# Patient Record
Sex: Male | Born: 1980 | Hispanic: No | Marital: Married | Smoking: Never smoker
Health system: Southern US, Community
[De-identification: ages and names within clinical notes are randomized; demographics above are authoritative.]

## PROBLEM LIST (undated history)

## (undated) DIAGNOSIS — E669 Obesity, unspecified: Secondary | ICD-10-CM

## (undated) HISTORY — PX: NO PAST SURGERIES: SHX2092

## (undated) HISTORY — DX: Obesity, unspecified: E66.9

---

## 2015-11-19 ENCOUNTER — Encounter (HOSPITAL_BASED_OUTPATIENT_CLINIC_OR_DEPARTMENT_OTHER): Payer: Self-pay | Admitting: *Deleted

## 2015-11-19 ENCOUNTER — Emergency Department (HOSPITAL_BASED_OUTPATIENT_CLINIC_OR_DEPARTMENT_OTHER): Payer: 59

## 2015-11-19 ENCOUNTER — Emergency Department (HOSPITAL_BASED_OUTPATIENT_CLINIC_OR_DEPARTMENT_OTHER)
Admission: EM | Admit: 2015-11-19 | Discharge: 2015-11-19 | Disposition: A | Discharge status: Home / Self Care | Payer: 59 | Attending: Emergency Medicine | Admitting: Emergency Medicine

## 2015-11-19 DIAGNOSIS — K644 Residual hemorrhoidal skin tags: Secondary | ICD-10-CM | POA: Diagnosis not present

## 2015-11-19 DIAGNOSIS — K649 Unspecified hemorrhoids: Secondary | ICD-10-CM

## 2015-11-19 DIAGNOSIS — K6289 Other specified diseases of anus and rectum: Secondary | ICD-10-CM | POA: Diagnosis present

## 2015-11-19 LAB — COMPREHENSIVE METABOLIC PANEL
ALK PHOS: 63 U/L (ref 38–126)
ALT: 39 U/L (ref 17–63)
AST: 27 U/L (ref 15–41)
Albumin: 4.7 g/dL (ref 3.5–5.0)
Anion gap: 8 (ref 5–15)
BUN: 11 mg/dL (ref 6–20)
CALCIUM: 9.7 mg/dL (ref 8.9–10.3)
CHLORIDE: 103 mmol/L (ref 101–111)
CO2: 26 mmol/L (ref 22–32)
CREATININE: 0.95 mg/dL (ref 0.61–1.24)
Glucose, Bld: 107 mg/dL — ABNORMAL HIGH (ref 65–99)
Potassium: 4.3 mmol/L (ref 3.5–5.1)
SODIUM: 137 mmol/L (ref 135–145)
Total Bilirubin: 0.7 mg/dL (ref 0.3–1.2)
Total Protein: 8.2 g/dL — ABNORMAL HIGH (ref 6.5–8.1)

## 2015-11-19 LAB — CBC WITH DIFFERENTIAL/PLATELET
Basophils Absolute: 0 10*3/uL (ref 0.0–0.1)
Basophils Relative: 0 %
EOS ABS: 0.2 10*3/uL (ref 0.0–0.7)
EOS PCT: 1 %
HCT: 46.3 % (ref 39.0–52.0)
Hemoglobin: 15.5 g/dL (ref 13.0–17.0)
LYMPHS ABS: 3.1 10*3/uL (ref 0.7–4.0)
Lymphocytes Relative: 27 %
MCH: 26.8 pg (ref 26.0–34.0)
MCHC: 33.5 g/dL (ref 30.0–36.0)
MCV: 80.1 fL (ref 78.0–100.0)
MONO ABS: 0.9 10*3/uL (ref 0.1–1.0)
MONOS PCT: 8 %
Neutro Abs: 7.4 10*3/uL (ref 1.7–7.7)
Neutrophils Relative %: 64 %
PLATELETS: 246 10*3/uL (ref 150–400)
RBC: 5.78 MIL/uL (ref 4.22–5.81)
RDW: 13.3 % (ref 11.5–15.5)
WBC: 11.6 10*3/uL — ABNORMAL HIGH (ref 4.0–10.5)

## 2015-11-19 MED ORDER — CLINDAMYCIN HCL 150 MG PO CAPS: 300.0000 mg | ORAL_CAPSULE | Freq: Three times a day (TID) | ORAL | Status: AC

## 2015-11-19 MED ORDER — IOHEXOL 300 MG/ML  SOLN
100.0000 mL | Freq: Once | INTRAMUSCULAR | Status: AC | PRN
  Administered 2015-11-19: 100 mL via INTRAVENOUS

## 2015-11-19 MED ORDER — HYDROCORTISONE 2.5 % RE CREA: 1.0000 "application " | TOPICAL_CREAM | Freq: Two times a day (BID) | RECTAL | Status: DC

## 2015-11-19 MED ORDER — ACETAMINOPHEN 500 MG PO TABS
1000.0000 mg | ORAL_TABLET | Freq: Once | ORAL | Status: AC
  Administered 2015-11-19: 1000 mg via ORAL
  Filled 2015-11-19: qty 2

## 2015-11-19 NOTE — ED Notes (Signed)
Having a lot of pain at rectal area, states not feeling any swelling or burning at area, has been having a fever at night. Having pressure at rectum as well. Pain increased with BM. Has increased water intact and sitting in bathtub with little relief

## 2015-11-19 NOTE — ED Notes (Signed)
Patient transported to CT 

## 2015-11-19 NOTE — ED Notes (Signed)
MD at bedside. 

## 2015-11-19 NOTE — ED Notes (Signed)
Denies any bleeding from site or noted any blood on toilet paper

## 2015-11-19 NOTE — ED Notes (Signed)
DC instructions reviewed with pt, discussed Rx as written by EDP, discussed importance of taking all of abx as prescribed and the use and application of rectal cream also prescribed by EDP. Also discussed non-pharmacological pain control.  Pt shown MD consult name, address and phone number, explained to call for follow up appointment. Opportunity for questions provided. Teach Back Method used

## 2015-11-19 NOTE — ED Notes (Signed)
States has had hemorrhoids before, has tried some OTC creams but no relief with this episode

## 2015-11-19 NOTE — ED Provider Notes (Signed)
CSN: [id number]     Arrival date & time 11/19/15  0915 History   First MD Initiated Contact with Patient 11/19/15 [phone]     Chief Complaint  Patient presents with  . Rectal Pain     (Consider location/radiation/quality/duration/timing/severity/associated sxs/prior Treatment) HPI Comments: Rectal pain No bleeding Since last Monday All day, not just with using restroom, this pain is constant 8/10 Sitz bath helped a bit Worse when having BM Fever close to 100 overnight, advil No cough/runny nose/abd pain No constipation/diarrhea Hx of hemorrhoids before, but had bleeding    History reviewed. No pertinent past medical history. History reviewed. No pertinent past surgical history. History reviewed. No pertinent family history. Social History  Substance Use Topics  . Smoking status: Never Smoker   . Smokeless tobacco: None  . Alcohol Use: Yes     Comment: social    Review of Systems  Constitutional: Negative for fever.  HENT: Negative for sore throat.   Eyes: Negative for visual disturbance.  Respiratory: Negative for shortness of breath.   Cardiovascular: Negative for chest pain.  Gastrointestinal: Negative for nausea, vomiting, abdominal pain, diarrhea, blood in stool and anal bleeding.  Genitourinary: Negative for difficulty urinating.  Musculoskeletal: Negative for back pain and neck stiffness.  Skin: Negative for rash.  Neurological: Negative for syncope and headaches.      Allergies  Review of patient's allergies indicates no known allergies.  Home Medications   Prior to Admission medications   Not on File   BP 133/84 mmHg  Pulse 108  Temp(Src) 98.5 F (36.9 C) (Oral)  Resp 20  Wt 228 lb (103.42 kg)  SpO2 98% Physical Exam  Constitutional: He is oriented to person, place, and time. He appears well-developed and well-nourished. No distress.  HENT:  Head: Normocephalic and atraumatic.  Eyes: Conjunctivae and EOM are normal.  Neck: Normal range of  motion.  Cardiovascular: Normal rate, regular rhythm, normal heart sounds and intact distal pulses.  Exam reveals no gallop and no friction rub.   No murmur heard. Pulmonary/Chest: Effort normal and breath sounds normal. No respiratory distress. He has no wheezes. He has no rales.  Abdominal: Soft. He exhibits no distension. There is no tenderness. There is no guarding.  Genitourinary: Rectal exam shows external hemorrhoid and tenderness (tenderness posterior to anus, swelling with tenderness, hemorrhoids, no clear fluctuance, significant pain).  Musculoskeletal: He exhibits no edema.  Neurological: He is alert and oriented to person, place, and time.  Skin: Skin is warm and dry. He is not diaphoretic.  Nursing note and vitals reviewed.   ED Course  Procedures (including critical care time) Labs Review Labs Reviewed - No data to display  Imaging Review No results found. I have personally reviewed and evaluated these images and lab results as part of my medical decision-making.   EKG Interpretation None      MDM   Final diagnoses:  None   35yo male with prior history of hemorrhoids presents with concern for constant rectal pain for 1 week.  Exam significant for hemorrhoids, and question of other area of tenderness/edema and given history of fever last night, ordered CT with contrast to evaluate for perirectal abscess.  CT pelvis showed possible early small abscess however favor edema. GIven small area, no clear fluctuance or area to drain on exam do not feel drainage or surgical consultation is indicated emergently, however given hx of fever, mild leukocytosis, will treat with clindamycin and provide information for follow up with surgery.  Patient  with signs of hemorrhoid on exam and recommended hemorrhoid care as well.  Patient discharged in stable condition with understanding of reasons to return.   Alvira Monday, MD 11/19/15 2124

## 2016-07-27 ENCOUNTER — Encounter: Payer: Self-pay | Admitting: Family Medicine

## 2016-07-27 ENCOUNTER — Ambulatory Visit (INDEPENDENT_AMBULATORY_CARE_PROVIDER_SITE_OTHER): Payer: 59 | Admitting: Family Medicine

## 2016-07-27 VITALS — BP 100/60 | HR 83 | Temp 99.0°F | Ht 73.0 in | Wt 239.8 lb

## 2016-07-27 DIAGNOSIS — E669 Obesity, unspecified: Secondary | ICD-10-CM | POA: Diagnosis not present

## 2016-07-27 DIAGNOSIS — M9902 Segmental and somatic dysfunction of thoracic region: Secondary | ICD-10-CM | POA: Diagnosis not present

## 2016-07-27 DIAGNOSIS — Z114 Encounter for screening for human immunodeficiency virus [HIV]: Secondary | ICD-10-CM

## 2016-07-27 HISTORY — DX: Obesity, unspecified: E66.9

## 2016-07-27 LAB — LIPID PANEL
CHOL/HDL RATIO: 5
Cholesterol: 221 mg/dL — ABNORMAL HIGH (ref 0–200)
HDL: 44 mg/dL (ref >39.00)
LDL CALC: 153 mg/dL — AB (ref 0–99)
NONHDL: 177.46
Triglycerides: 121 mg/dL (ref 0.0–149.0)
VLDL: 24.2 mg/dL (ref 0.0–40.0)

## 2016-07-27 LAB — COMPREHENSIVE METABOLIC PANEL
ALT: 44 U/L (ref 0–53)
AST: 26 U/L (ref 0–37)
Albumin: 4.3 g/dL (ref 3.5–5.2)
Alkaline Phosphatase: 60 U/L (ref 39–117)
BILIRUBIN TOTAL: 0.4 mg/dL (ref 0.2–1.2)
BUN: 8 mg/dL (ref 6–23)
CALCIUM: 9.4 mg/dL (ref 8.4–10.5)
CHLORIDE: 103 meq/L (ref 96–112)
CO2: 30 meq/L (ref 19–32)
CREATININE: 0.9 mg/dL (ref 0.40–1.50)
GFR: 102.84 mL/min (ref >60.00)
Glucose, Bld: 78 mg/dL (ref 70–99)
Potassium: 4.6 mEq/L (ref 3.5–5.1)
SODIUM: 139 meq/L (ref 135–145)
Total Protein: 7.9 g/dL (ref 6.0–8.3)

## 2016-07-27 LAB — HEMOGLOBIN A1C: Hgb A1c MFr Bld: 5.8 % (ref 4.6–6.5)

## 2016-07-27 NOTE — Progress Notes (Signed)
Chief Complaint  Patient presents with  . Establish Care    pt requesting CPE,discuss shoulder pain (L)x 6-7 mos,and hemrrhoids       New Patient Visit  SUBJECTIVE: HPI: Jacob Krause is an 35 y.o.male who is being seen for establishing care.  The patient Has not seen a primary care provider in many years.  The patient has had 6-7 months of intermittent discomfort on the left side of his neck, upper back, and shoulder. There was no injury or change in activity that started this. He believes it may be due to his posture at work. He tends to get it at the end of the day and last through the night into the following morning. He does not have it currently. He describes a sharp and aching, radiating down his back. He has tried BenGay that has been helpful. He does not exercise routinely. He denies weakness, numbness, tingling, or swelling.  No Known Allergies  Past Medical History:  Diagnosis Date  . Obesity 07/27/2016   History reviewed. No pertinent surgical history. Social History   Social History  . Marital status: Married   Social History Main Topics  . Smoking status: Never Smoker  . Smokeless tobacco: Never Used  . Alcohol use Yes     Comment: social  . Drug use: No   Family History  Problem Relation Age of Onset  . Cancer Father   . Diabetes Father   . Diabetes Paternal Grandmother   . Heart attack Paternal Grandfather    Takes no medications routinely.  ROS MSK: As noted in history of present illness   Neuro: Denies Numbness, tingling, weakness    OBJECTIVE: BP 100/60 (BP Location: Left Arm, Patient Position: Sitting, Cuff Size: Large)   Pulse 83   Temp 99 F (37.2 C) (Oral)   Ht 6\' 1"  (1.854 m)   Wt 239 lb 12.8 oz (108.8 kg)   SpO2 98%   BMI 31.64 kg/m   Constitutional: -  VS reviewed -  Well developed, well nourished, appears stated age -  No apparent distress  Psychiatric: -  Oriented to person, place, and time -  Memory intact -  Affect  and mood normal -  Fluent conversation, good eye contact -  Judgment and insight age appropriate  Eye: -  Conjunctivae clear, no discharge -  Pupils symmetric, round, reactive to light  Neck: -  No gross swelling, no palpable masses -  Thyroid midline, not enlarged, mobile, no palpable masses  Cardiovascular: -  RRR, no murmurs -  No LE edema  Respiratory: -  Normal respiratory effort, no accessory muscle use, no retraction -  Breath sounds equal, no wheezes, no ronchi, no crackles  Neurological:  -  CN II - XII grossly intact -  UE DTR's 1/4 b/l wo clonus -  Sensation grossly intact to light touch, equal bilaterally  Musculoskeletal: -  No clubbing, no cyanosis -  Gait normal -  Negative O'Brien's, speed's, Crossover, Crossover O'Brien's, empty can, Neer's, Leanord Asal, Liftoff, Spurling's   Skin: -  No significant lesion on inspection -  Warm and dry to palpation   ASSESSMENT/PLAN: Somatic dysfunction of spine, thoracic  Obesity - Plan: Hemoglobin A1c, Comprehensive metabolic panel, Lipid panel  Encounter for screening for HIV - Plan: HIV antibody  Orders as above. Home stretches and exercises were provided. He'll try this for 4-6 weeks and if symptoms fail to improve, he will let us know and we'll try physical therapy at that  time. Patient should return for complete physical if he wishes, or a can return in one year for complete physical. I will see him sooner if his labs are abnormal. The patient voiced understanding and agreement to the plan.   Jilda Rocheicholas Paul [place], DO 07/27/16  10:03 AM

## 2016-07-27 NOTE — Patient Instructions (Signed)
EXERCISES RANGE OF MOTION (ROM) AND STRETCHING EXERCISES   Restoring tissue flexibility helps normal motion to return to the joints. This allows healthier, less painful movement and activity.  An effective stretch should be held for at least 20 seconds, although you may need to begin with shorter hold times for comfort.  A stretch should never be painful. You should only feel a gentle lengthening or release in the stretched tissue.  STRETCH- Axial Extensors  Lie on your back on the floor. You may bend your knees for comfort. Place a rolled-up hand towel or dish towel, about 2 inches in diameter, under the part of your head that makes contact with the floor.  Gently tuck your chin, as if trying to make a "double chin," until you feel a gentle stretch at the base of your head.  Hold seconds 15-20 seconds. Repeat 2-3 times. Complete this exercise 1 time per day.   STRETCH - Axial Extension   Stand or sit on a firm surface. Assume a good posture: chest up, shoulders drawn back, abdominal muscles slightly tense, knees unlocked (if standing) and feet hip width apart.  Slowly retract your chin so your head slides back and your chin slightly lowers. Continue to look straight ahead.  You should feel a gentle stretch in the back of your head. Be certain not to feel an aggressive stretch since this can cause headaches later.  Hold for __________ seconds. Repeat __________ times. Complete this exercise __________ times per day.  STRETCH - Cervical Side Bend   Stand or sit on a firm surface. Assume a good posture: chest up, shoulders drawn back, abdominal muscles slightly tense, knees unlocked (if standing) and feet hip width apart.  Without letting your nose or shoulders move, slowly tip your right / left ear to your shoulder until your feel a gentle stretch in the muscles on the opposite side of your neck.  Hold __________ seconds. Repeat __________ times. Complete this exercise __________  times per day.  STRETCH - Cervical Rotators   Stand or sit on a firm surface. Assume a good posture: chest up, shoulders drawn back, abdominal muscles slightly tense, knees unlocked (if standing) and feet hip width apart.  Keeping your eyes level with the ground, slowly turn your head until you feel a gentle stretch along the back and opposite side of your neck.  Hold __________ seconds. Repeat __________ times. Complete this exercise __________ times per day.  RANGE OF MOTION - Neck Circles   Stand or sit on a firm surface. Assume a good posture: chest up, shoulders drawn back, abdominal muscles slightly tense, knees unlocked (if standing) and feet hip width apart.  Gently roll your head down and around from the back of one shoulder to the back of the other. The motion should never be forced or painful.  Repeat the motion 10-20 times, or until you feel the neck muscles relax and loosen. Repeat __________ times. Complete the exercise __________ times per day. STRENGTHENING EXERCISES - Cervical Strain and Sprain These exercises may help you when beginning to rehabilitate your injury. They may resolve your symptoms with or without further involvement from your physician, physical therapist, or athletic trainer. While completing these exercises, remember:   Muscles can gain both the endurance and the strength needed for everyday activities through controlled exercises.  Complete these exercises as instructed by your physician, physical therapist, or athletic trainer. Progress the resistance and repetitions only as guided.  You may experience muscle soreness or fatigue,  but the pain or discomfort you are trying to eliminate should never worsen during these exercises. If this pain does worsen, stop and make certain you are following the directions exactly. If the pain is still present after adjustments, discontinue the exercise until you can discuss the trouble with your clinician.  STRENGTH -  Cervical Flexors, Isometric  Face a wall, standing about 6 inches away. Place a small pillow, a ball about 6-8 inches in diameter, or a folded towel between your forehead and the wall.  Slightly tuck your chin and gently push your forehead into the soft object. Push only with mild to moderate intensity, building up tension gradually. Keep your jaw and forehead relaxed.  Hold 10 to 20 seconds. Keep your breathing relaxed.  Release the tension slowly. Relax your neck muscles completely before you start the next repetition. Repeat __________ times. Complete this exercise __________ times per day.  STRENGTH- Cervical Lateral Flexors, Isometric   Stand about 6 inches away from a wall. Place a small pillow, a ball about 6-8 inches in diameter, or a folded towel between the side of your head and the wall.  Slightly tuck your chin and gently tilt your head into the soft object. Push only with mild to moderate intensity, building up tension gradually. Keep your jaw and forehead relaxed.  Hold 10 to 20 seconds. Keep your breathing relaxed.  Release the tension slowly. Relax your neck muscles completely before you start the next repetition. Repeat __________ times. Complete this exercise __________ times per day.  STRENGTH - Cervical Extensors, Isometric   Stand about 6 inches away from a wall. Place a small pillow, a ball about 6-8 inches in diameter, or a folded towel between the back of your head and the wall.  Slightly tuck your chin and gently tilt your head back into the soft object. Push only with mild to moderate intensity, building up tension gradually. Keep your jaw and forehead relaxed.  Hold 10 to 20 seconds. Keep your breathing relaxed.  Release the tension slowly. Relax your neck muscles completely before you start the next repetition. Repeat __________ times. Complete this exercise __________ times per day. POSTURE AND BODY MECHANICS CONSIDERATIONS - Cervical Strain and  Sprain Keeping correct posture when sitting, standing or completing your activities will reduce the stress put on different body tissues, allowing injured tissues a chance to heal and limiting painful experiences. The following are general guidelines for improved posture. Your physician or physical therapist will provide you with any instructions specific to your needs. While reading these guidelines, remember:  The exercises prescribed by your provider will help you have the flexibility and strength to maintain correct postures.  The correct posture provides the optimal environment for your joints to work. All of your joints have less wear and tear when properly supported by a spine with good posture. This means you will experience a healthier, less painful body.  Correct posture must be practiced with all of your activities, especially prolonged sitting and standing. Correct posture is as important when doing repetitive low-stress activities (typing) as it is when doing a single heavy-load activity (lifting).  PROLONGED STANDING WHILE SLIGHTLY LEANING FORWARD When completing a task that requires you to lean forward while standing in one place for a long time, place either foot up on a stationary 2- to 4-inch high object to help maintain the best posture. When both feet are on the ground, the low back tends to lose its slight inward curve. If this curve  flattens (or becomes too large), then the back and your other joints will experience too much stress, fatigue more quickly, and can cause pain.   RESTING POSITIONS Consider which positions are most painful for you when choosing a resting position. If you have pain with flexion-based activities (sitting, bending, stooping, squatting), choose a position that allows you to rest in a less flexed posture. You would want to avoid curling into a fetal position on your side. If your pain worsens with extension-based activities (prolonged standing, working  overhead), avoid resting in an extended position such as sleeping on your stomach. Most people will find more comfort when they rest with their spine in a more neutral position, neither too rounded nor too arched. Lying on a non-sagging bed on your side with a pillow between your knees, or on your back with a pillow under your knees will often provide some relief. Keep in mind, being in any one position for a prolonged period of time, no matter how correct your posture, can still lead to stiffness.  WALKING Walk with an upright posture. Your ears, shoulders, and hips should all line up. OFFICE WORK When working at a desk, create an environment that supports good, upright posture. Without extra support, muscles fatigue and lead to excessive strain on joints and other tissues.  CHAIR:  A chair should be able to slide under your desk when your back makes contact with the back of the chair. This allows you to work closely.  The chair's height should allow your eyes to be level with the upper part of your monitor and your hands to be slightly lower than your elbows.  Body position: ? Your feet should make contact with the floor. If this is not possible, use a foot rest. ? Keep your ears over your shoulders. This will reduce stress on your neck and low back.

## 2016-07-27 NOTE — Progress Notes (Signed)
Pre visit review using our clinic review tool, if applicable. No additional management support is needed unless otherwise documented below in the visit note. 

## 2016-07-28 LAB — HIV ANTIBODY (ROUTINE TESTING W REFLEX): HIV 1&2 Ab, 4th Generation: NONREACTIVE

## 2017-06-10 IMAGING — CT CT PELVIS W/ CM
2 of 3 series · 16 of 46 positions shown, 18 images · IV contrast (omnipaque)
Comparison: None.

CLINICAL DATA: Rectal pain for 1 week. History of hemorrhoids.
Increasing pain with bowel movements. Denies bleeding.

EXAM:
CT PELVIS WITH CONTRAST
TECHNIQUE: Multidetector CT imaging of the pelvis was performed using the
standard protocol following the bolus administration of intravenous
contrast.
CONTRAST:  100mL OMNIPAQUE IOHEXOL 300 MG/ML  SOLN

[Series 2: axial st · axial · 0.98mm/px · z∈[-410,-100]mm · 13 of 72 slices shown, 15 images]
[im 5/72  soft-tissue]
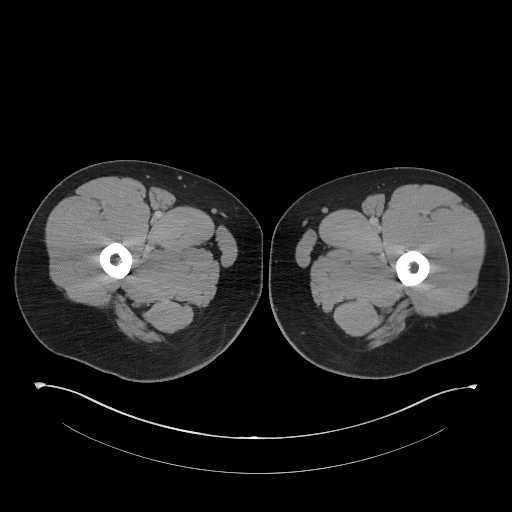
[im 5/72  bone]
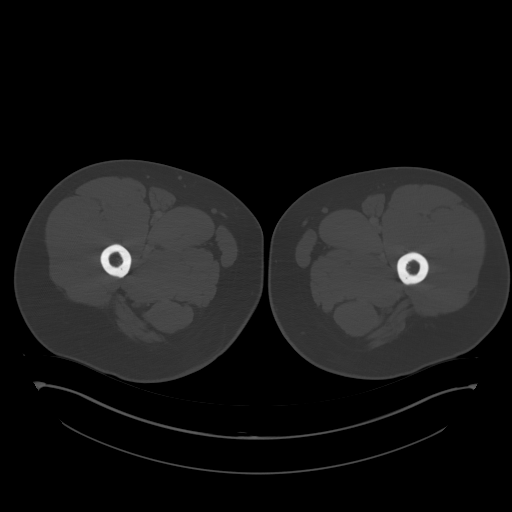
[im 10/72  soft-tissue]
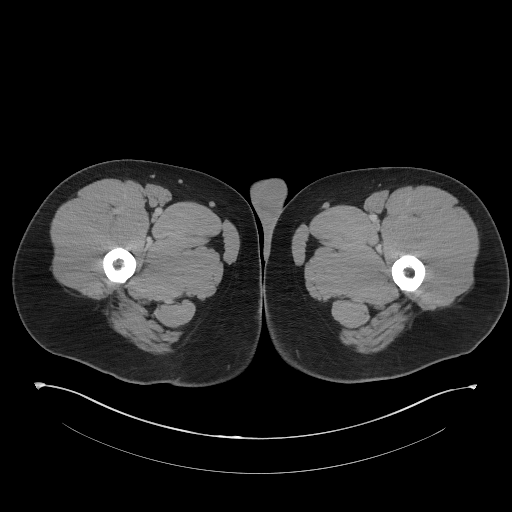
[im 14/72  soft-tissue]
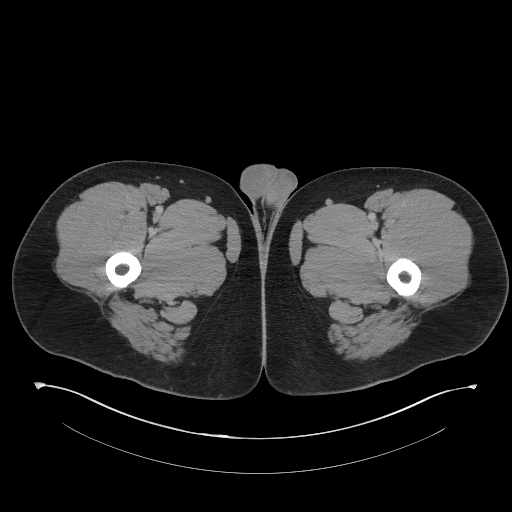
[im 21/72  soft-tissue]
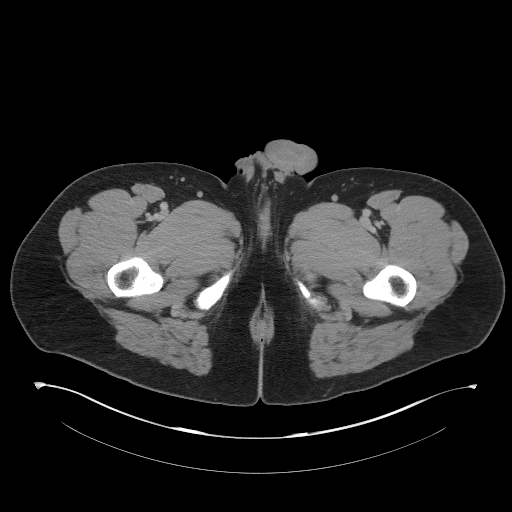
[im 26/72  soft-tissue]
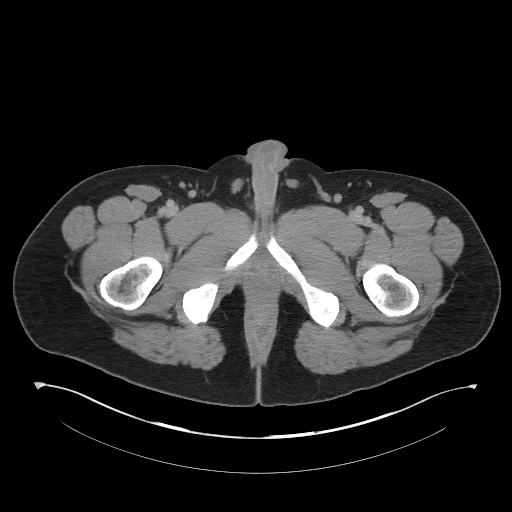
[im 30/72  soft-tissue]
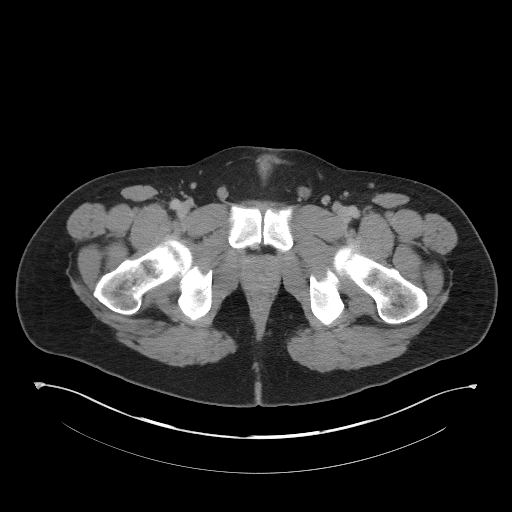
[im 37/72  soft-tissue]
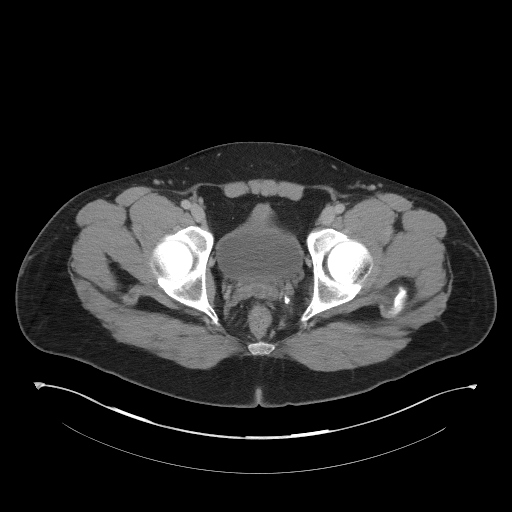
[im 42/72  soft-tissue]
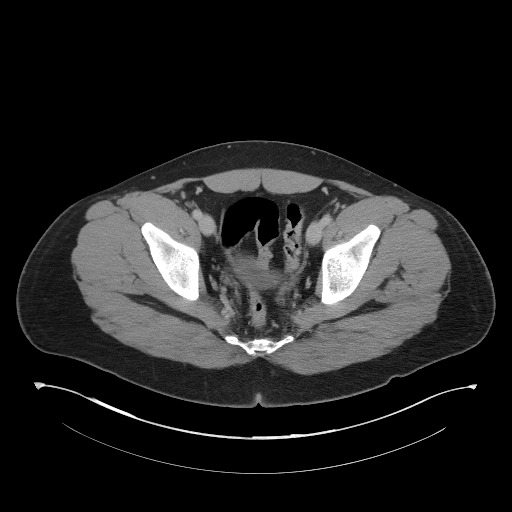
[im 46/72  soft-tissue]
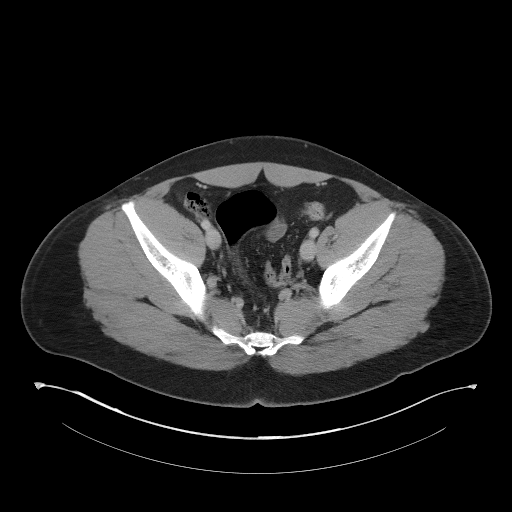
[im 46/72  bone]
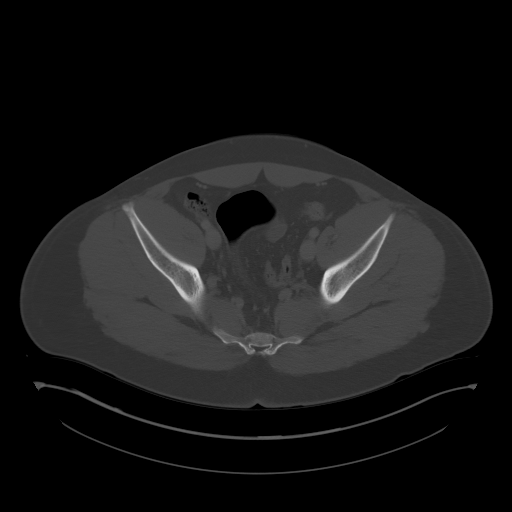
[im 51/72  soft-tissue]
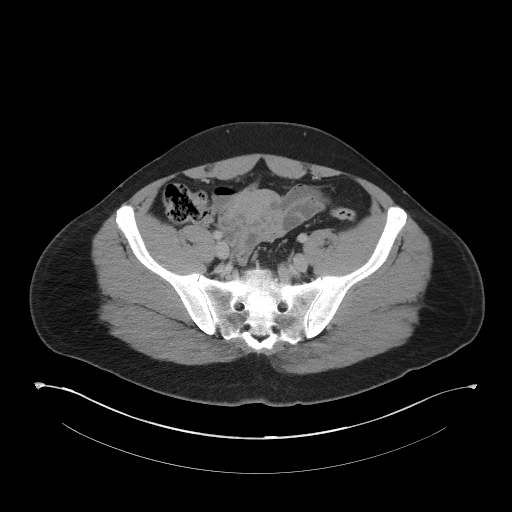
[im 58/72  soft-tissue]
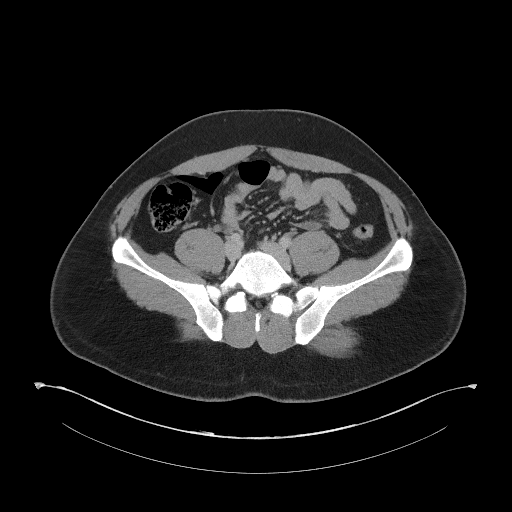
[im 62/72  soft-tissue]
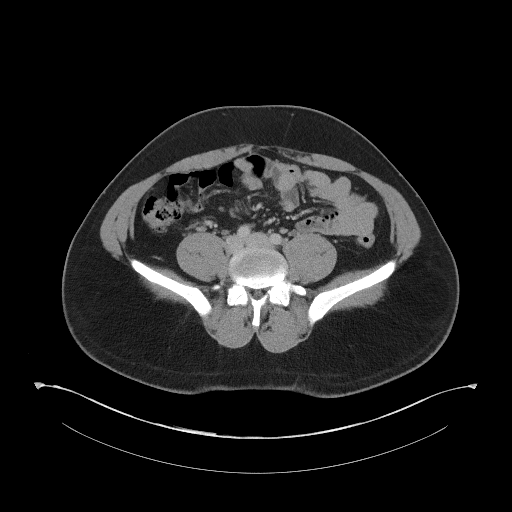
[im 67/72  soft-tissue]
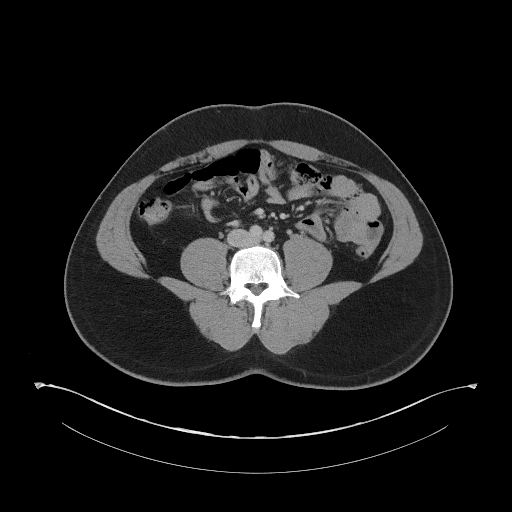

[Series 4: coronal st · coronal · 0.74mm/px · 3 of 90 slices shown]
[im 30/90  soft-tissue]
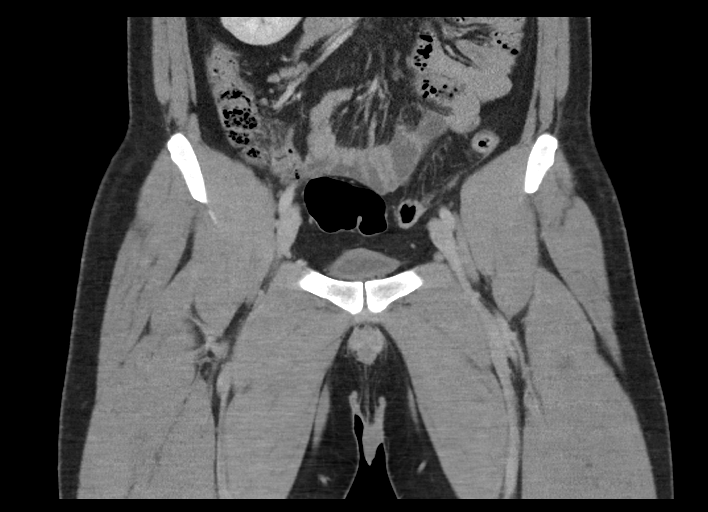
[im 40/90  soft-tissue]
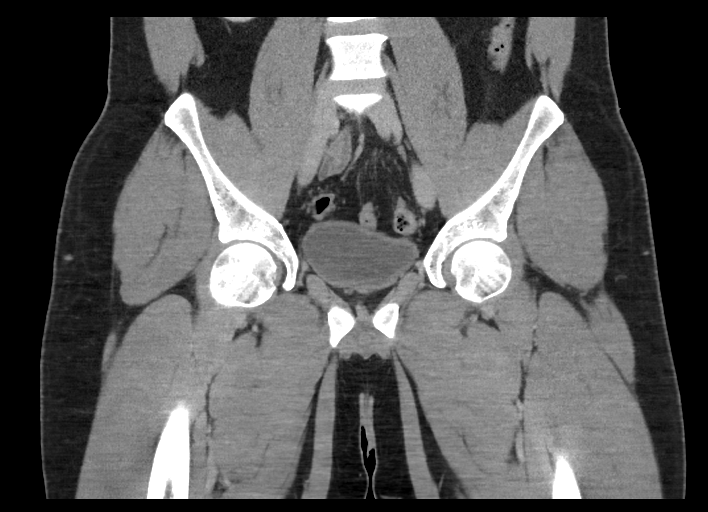
[im 50/90  soft-tissue]
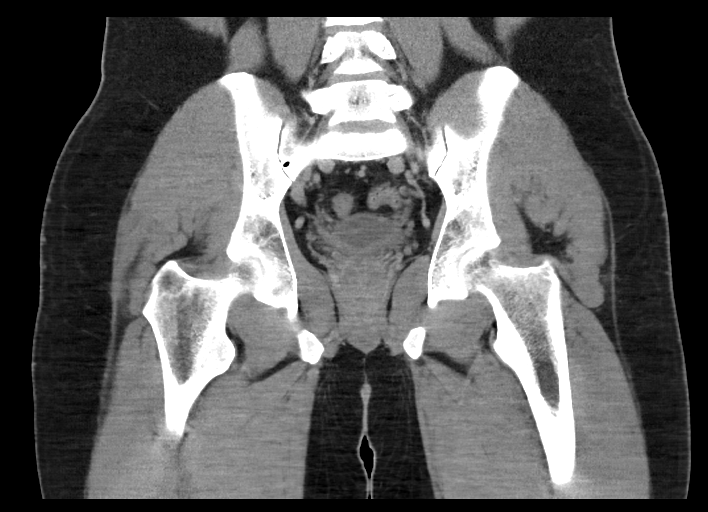

[16 of 46 positions shown; findings below may reference images not displayed]

FINDINGS: Pelvic portion of the bowel is normal in caliber. No bowel wall
thickening or evidence of bowel wall inflammation. No free fluid or
abscess collection identified within the intraperitoneal pelvis. No
free fluid or abscess collection within the perirectal soft tissues.
No free intraperitoneal air.

There is a subtle low-density focus adjacent to the anal verge,
measuring 11 x 8 mm. This is of uncertain significance but may
represent confluent edema within the anal wall or small perianal
abscess. The adjacent soft tissues of the perineum are unremarkable.

Mild degenerative change noted within the lower lumbar spine. No
acute osseous abnormality. Superficial subcutaneous soft tissues
about the pelvis are unremarkable.
IMPRESSION: 1. Subtle low-density focus at the level of the anal verge,
measuring 11 x 8 mm, suspicious for confluent edema within the anal
wall or possibly small abscess collection (series 2, image 47).
There is no air within the collection to confirm abscess, so favor
edema. No evidence of an adjacent fistula. The more superior
perirectal soft tissues appear normal.
2. Intraperitoneal pelvis is unremarkable. No intraperitoneal free
fluid or abscess collection. No bowel obstruction or bowel wall
inflammation.

## 2017-09-12 ENCOUNTER — Encounter: Payer: Self-pay | Admitting: Family Medicine

## 2017-09-12 ENCOUNTER — Ambulatory Visit (INDEPENDENT_AMBULATORY_CARE_PROVIDER_SITE_OTHER): Payer: 59 | Admitting: Family Medicine

## 2017-09-12 VITALS — BP 120/82 | HR 65 | Temp 98.5°F | Ht 73.0 in | Wt 205.2 lb

## 2017-09-12 DIAGNOSIS — K644 Residual hemorrhoidal skin tags: Secondary | ICD-10-CM

## 2017-09-12 DIAGNOSIS — Z0000 Encounter for general adult medical examination without abnormal findings: Secondary | ICD-10-CM

## 2017-09-12 LAB — CBC
HCT: 48.8 % (ref 39.0–52.0)
HEMOGLOBIN: 15.9 g/dL (ref 13.0–17.0)
MCHC: 32.7 g/dL (ref 30.0–36.0)
MCV: 85 fl (ref 78.0–100.0)
Platelets: 242 10*3/uL (ref 150.0–400.0)
RBC: 5.74 Mil/uL (ref 4.22–5.81)
RDW: 13.6 % (ref 11.5–15.5)
WBC: 8.2 10*3/uL (ref 4.0–10.5)

## 2017-09-12 LAB — COMPREHENSIVE METABOLIC PANEL
ALK PHOS: 60 U/L (ref 39–117)
ALT: 23 U/L (ref 0–53)
AST: 24 U/L (ref 0–37)
Albumin: 4.7 g/dL (ref 3.5–5.2)
BILIRUBIN TOTAL: 0.5 mg/dL (ref 0.2–1.2)
BUN: 8 mg/dL (ref 6–23)
CO2: 29 meq/L (ref 19–32)
CREATININE: 0.87 mg/dL (ref 0.40–1.50)
Calcium: 9.8 mg/dL (ref 8.4–10.5)
Chloride: 102 mEq/L (ref 96–112)
GFR: 106.23 mL/min (ref >60.00)
GLUCOSE: 91 mg/dL (ref 70–99)
Potassium: 4.5 mEq/L (ref 3.5–5.1)
SODIUM: 137 meq/L (ref 135–145)
Total Protein: 7.8 g/dL (ref 6.0–8.3)

## 2017-09-12 LAB — LIPID PANEL
Cholesterol: 206 mg/dL — ABNORMAL HIGH (ref 0–200)
HDL: 62.2 mg/dL (ref >39.00)
LDL Cholesterol: 131 mg/dL — ABNORMAL HIGH (ref 0–99)
NONHDL: 143.45
Total CHOL/HDL Ratio: 3
Triglycerides: 64 mg/dL (ref 0.0–149.0)
VLDL: 12.8 mg/dL (ref 0.0–40.0)

## 2017-09-12 MED ORDER — HYDROCORTISONE 2.5 % RE CREA: 1.0000 "application " | TOPICAL_CREAM | Freq: Two times a day (BID) | RECTAL | 0 refills | Status: AC

## 2017-09-12 NOTE — Progress Notes (Signed)
Pre visit review using our clinic review tool, if applicable. No additional management support is needed unless otherwise documented below in the visit note. 

## 2017-09-12 NOTE — Patient Instructions (Addendum)
Hemorrhoids Hemorrhoids are swollen veins in and around the rectum or anus. There are two types of hemorrhoids:  Internal hemorrhoids. These occur in the veins that are just inside the rectum. They may poke through to the outside and become irritated and painful.  External hemorrhoids. These occur in the veins that are outside of the anus and can be felt as a painful swelling or hard lump near the anus.  Most hemorrhoids do not cause serious problems, and they can be managed with home treatments such as diet and lifestyle changes. If home treatments do not help your symptoms, procedures can be done to shrink or remove the hemorrhoids. What are the causes? This condition is caused by increased pressure in the anal area. This pressure may result from various things, including:  Constipation.  Straining to have a bowel movement.  Diarrhea.  Pregnancy.  Obesity.  Sitting for long periods of time.  Heavy lifting or other activity that causes you to strain.  Anal sex.  What are the signs or symptoms? Symptoms of this condition include:  Pain.  Anal itching or irritation.  Rectal bleeding.  Leakage of stool (feces).  Anal swelling.  One or more lumps around the anus.  How is this diagnosed? This condition can often be diagnosed through a visual exam. Other exams or tests may also be done, such as:  Examination of the rectal area with a gloved hand (digital rectal exam).  Examination of the anal canal using a small tube (anoscope).  A blood test, if you have lost a significant amount of blood.  A test to look inside the colon (sigmoidoscopy or colonoscopy).  How is this treated? This condition can usually be treated at home. However, various procedures may be done if dietary changes, lifestyle changes, and other home treatments do not help your symptoms. These procedures can help make the hemorrhoids smaller or remove them completely. Some of these procedures involve  surgery, and others do not. Common procedures include:  Rubber band ligation. Rubber bands are placed at the base of the hemorrhoids to cut off the blood supply to them.  Sclerotherapy. Medicine is injected into the hemorrhoids to shrink them.  Infrared coagulation. A type of light energy is used to get rid of the hemorrhoids.  Hemorrhoidectomy surgery. The hemorrhoids are surgically removed, and the veins that supply them are tied off.  Stapled hemorrhoidopexy surgery. A circular stapling device is used to remove the hemorrhoids and use staples to cut off the blood supply to them.  Follow these instructions at home: Eating and drinking  Eat foods that have a lot of fiber in them, such as whole grains, beans, nuts, fruits, and vegetables. Ask your health care provider about taking products that have added fiber (fiber supplements).  Drink enough fluid to keep your urine clear or pale yellow. Managing pain and swelling  Take warm sitz baths for 20 minutes, 3-4 times a day to ease pain and discomfort.  If directed, apply ice to the affected area. Using ice packs between sitz baths may be helpful. ? Put ice in a plastic bag. ? Place a towel between your skin and the bag. ? Leave the ice on for 20 minutes, 2-3 times a day. General instructions  Take over-the-counter and prescription medicines only as told by your health care provider.  Use medicated creams or suppositories as told.  Exercise regularly.  Go to the bathroom when you have the urge to have a bowel movement. Do not wait.    Avoid straining to have bowel movements.  Keep the anal area dry and clean. Use wet toilet paper or moist towelettes after a bowel movement.  Do not sit on the toilet for long periods of time. This increases blood pooling and pain. Contact a health care provider if:  You have increasing pain and swelling that are not controlled by treatment or medicine.  You have uncontrolled bleeding.  You  have difficulty having a bowel movement, or you are unable to have a bowel movement.  You have pain or inflammation outside the area of the hemorrhoids. This information is not intended to replace advice given to you by your health care provider. Make sure you discuss any questions you have with your health care provider. Document Released: 10/12/2000 Document Revised: 03/14/2016 Document Reviewed: 06/29/2015 Elsevier Interactive Patient Education  2017 [employer]  

## 2017-09-12 NOTE — Progress Notes (Signed)
Chief Complaint  Patient presents with  . Annual Exam    hemorrhoids    Well Male Jacob Krause is here for a complete physical.   His last physical was >1 year ago.  Current diet: in general, a "healthy" diet   Current exercise: running, [employer] Weight trend: losing steadily, 35 lbs in one year Does pt snore? No. Daytime fatigue? No. Seat belt? Yes.     Health maintenance Tetanus- Yes HIV- Yes   He has been having issues with hemorrhoids over the past year and a half.  Since Monday, he has had one that is not going away.  It is not particularly painful or itchy.  There is no drainage or blood from it.  He has increased the fiber in his diet and with supplementation.  He is not straining or having issues with constipation.  He is tried an over-the-counter cream that has been mildly helpful.  He is getting better overall, however does not think the bulge is getting any smaller.  Past Medical History:  Diagnosis Date  . Obesity 07/27/2016    Past Surgical History:  Procedure Laterality Date  . NO PAST SURGERIES       Medications  Takes no medications routinely.   Allergies No Known Allergies   Family History Family History  Problem Relation Age of Onset  . Cancer Father   . Diabetes Father   . Diabetes Paternal Grandmother   . Heart attack Paternal Grandfather     Review of Systems: Constitutional:  no unexpected change in weight, no fevers or chills Eye:  no recent significant change in vision Ear/Nose/Mouth/Throat:  Ears:  no tinnitus or hearing loss Nose/Mouth/Throat:  no complaints of nasal congestion or bleeding, no sore throat and oral sores Cardiovascular:  no chest pain, no palpitations Respiratory:  no cough and no shortness of breath Gastrointestinal:  no abdominal pain, no change in bowel habits, no nausea, vomiting, diarrhea, or constipation and no black or bloody stool GU:  Male: negative for dysuria, frequency, and incontinence and negative  for prostate symptoms Musculoskeletal/Extremities:  no pain, redness, or swelling of the joints Integumentary (Skin/Breast): +lump in anal area; no abnormal skin lesions reported Neurologic:  no headaches, no numbness, tingling Endocrine: No weight changes, masses in the neck, heat/cold intolerance, bowel or skin changes, or cardiovascular system symptoms Hematologic/Lymphatic:  no abnormal bleeding, no HIV risk factors, no night sweats, no swollen nodes, no weight loss  Exam BP 120/82 (BP Location: Left Arm, Patient Position: Sitting, Cuff Size: Large)   Pulse 65   Temp 98.5 F (36.9 C) (Oral)   Ht 6\' 1"  (1.854 m)   Wt 205 lb 4 oz (93.1 kg)   SpO2 99%   BMI 27.08 kg/m  General:  well developed, well nourished, in no apparent distress Skin:  no significant moles, warts, or growths Head:  no masses, lesions, or tenderness Eyes:  pupils equal and round, sclera anicteric without injection Ears:  canals without lesions, TMs shiny without retraction, no obvious effusion, no erythema Nose:  nares patent, septum midline, mucosa normal Throat/Pharynx:  lips and gingiva without lesion; tongue and uvula midline; non-inflamed pharynx; no exudates or postnasal drainage Neck: neck supple without adenopathy, thyromegaly, or masses Lungs:  clear to auscultation, breath sounds equal bilaterally, no respiratory distress Cardio:  regular rate and rhythm without murmurs, heart sounds without clicks or rubs Abdomen:  abdomen soft, nontender; bowel sounds normal; no masses or organomegaly Rectal: external hem noted on R, mild  amount of thrombosis appreciated, no fluctuance, drainage, bleeding, skin breaks, erythema Musculoskeletal:  symmetrical muscle groups noted without atrophy or deformity Extremities:  no clubbing, cyanosis, or edema, no deformities, no skin discoloration Neuro:  gait normal; deep tendon reflexes normal and symmetric Psych: well oriented with normal range of affect and appropriate  judgment/insight  Assessment and Plan  Well adult exam - Plan: CBC, Comprehensive metabolic panel, Lipid panel  External hemorrhoid   Well 36 y.o. male. Counseled on diet and exercise. Other orders as above. Try rx strength steroid, f/u next week for removal of thrombus if no improvement. Follow up in 1 year pending the above workup otherwise. The patient voiced understanding and agreement to the plan.  Jacob Rocheicholas Paul [place], DO 09/12/17 12:09 PM

## 2018-12-30 ENCOUNTER — Encounter: Payer: Self-pay | Admitting: Internal Medicine

## 2018-12-30 ENCOUNTER — Ambulatory Visit (INDEPENDENT_AMBULATORY_CARE_PROVIDER_SITE_OTHER): Payer: 59 | Admitting: Internal Medicine

## 2018-12-30 VITALS — BP 108/72 | HR 82 | Temp 98.2°F | Resp 16 | Ht 73.0 in | Wt 203.4 lb

## 2018-12-30 DIAGNOSIS — M79604 Pain in right leg: Secondary | ICD-10-CM | POA: Diagnosis not present

## 2018-12-30 DIAGNOSIS — R509 Fever, unspecified: Secondary | ICD-10-CM

## 2018-12-30 MED ORDER — CYCLOBENZAPRINE HCL 10 MG PO TABS: 10.0000 mg | ORAL_TABLET | Freq: Every evening | ORAL | 0 refills | Status: DC | PRN

## 2018-12-30 NOTE — Patient Instructions (Addendum)
We will refer you to the orthopedic doctor  Take Flexeril as needed  IBUPROFEN (Advil or Motrin) 200 mg 2 tablets every 8 hours as needed for pain.  Always take it with food because may cause gastritis and ulcers.  If you notice nausea, stomach pain, change in the color of stools --->  Stop the medicine and let us know    Please use a mask for the next 2 to 3 days  Check your temperature daily, if it is higher than 100.4: Please call the office  Also call if you have severe cough, difficulty breathing, sore throat, nasal discharge.

## 2018-12-30 NOTE — Progress Notes (Signed)
Pre visit review using our clinic review tool, if applicable. No additional management support is needed unless otherwise documented below in the visit note. 

## 2018-12-30 NOTE — Progress Notes (Signed)
Subjective:    Patient ID: Jacob Krause, male    DOB: 05/05/1981, 38 y.o.   MRN: [id number]  DOS:  12/30/2018 Type of visit - description: Acute 3 months history of pain from the right buttock to the right heel.  Pain is described as a sharp feeling, tingling or numbness. The last few days has been getting worse. The pain is more persistent and increased when he stands up. Denies swelling.  Also, he reports when I ask that he had fever 2 days ago and cough.  Symptoms self resolved. He returned from a trip to [place] 10 days ago, he stopped in [place], [place]. He is currently feeling well. Last dose of Tylenol was 2 days ago.   Review of Systems No nausea, vomiting, diarrhea When he had cough he had no sputum production. No myalgias. No rash  Past Medical History:  Diagnosis Date  . Obesity 07/27/2016    Past Surgical History:  Procedure Laterality Date  . NO PAST SURGERIES      Social History   Socioeconomic History  . Marital status: Married    Spouse name: Not on file  . Number of children: Not on file  . Years of education: Not on file  . Highest education level: Not on file  Occupational History  . Not on file  Social Needs  . Financial resource strain: Not on file  . Food insecurity:    Worry: Not on file    Inability: Not on file  . Transportation needs:    Medical: Not on file    Non-medical: Not on file  Tobacco Use  . Smoking status: Never Smoker  . Smokeless tobacco: Never Used  Substance and Sexual Activity  . Alcohol use: Yes    Comment: social  . Drug use: No  . Sexual activity: Not on file  Lifestyle  . Physical activity:    Days per week: Not on file    Minutes per session: Not on file  . Stress: Not on file  Relationships  . Social connections:    Talks on phone: Not on file    Gets together: Not on file    Attends religious service: Not on file    Active member of club or organization: Not on file    Attends meetings  of clubs or organizations: Not on file    Relationship status: Not on file  . Intimate partner violence:    Fear of current or ex partner: Not on file    Emotionally abused: Not on file    Physically abused: Not on file    Forced sexual activity: Not on file  Other Topics Concern  . Not on file  Social History Narrative  . Not on file      Allergies as of 12/30/2018   No Known Allergies     Medication List       Accurate as of December 30, 2018 11:59 PM. Always use your most recent med list.        cyclobenzaprine 10 MG tablet Commonly known as:  FLEXERIL Take 1 tablet (10 mg total) by mouth at bedtime as needed for muscle spasms.           Objective:   Physical Exam BP 108/72 (BP Location: Left Arm, Patient Position: Sitting, Cuff Size: Small)   Pulse 82   Temp 98.2 F (36.8 C) (Oral)   Resp 16   Ht 6\' 1"  (1.854 m)   Wt 203 lb  6 oz (92.3 kg)   SpO2 98%   BMI 26.83 kg/m  General:   Well developed, NAD, BMI noted. HEENT:  Normocephalic . Face symmetric, atraumatic.  Nose not congested, throat not red. Lungs:  CTA B Normal respiratory effort, no intercostal retractions, no accessory muscle use. Heart: RRR,  no murmur.  No pretibial edema bilaterally Calves: Soft and symmetric Skin: Not pale. Not jaundice Neurologic:  alert & oriented X3.  Speech normal, gait appropriate for age and unassisted.  Gait is somewhat antalgic due to right leg pain. DTRs: Slightly decreased ankle jerk on the right?  Otherwise symmetric. Strength symmetric Question of right straight leg test on the right. Psych--  Cognition and judgment appear intact.  Cooperative with normal attention span and concentration.  Behavior appropriate. No anxious or depressed appearing.      Assessment    38 year old gentleman, healthy, presents with  Radiculopathy: Symptoms consistent with right-sided radiculopathy. Recommend Ortho referral, ibuprofen with GI precautions and Flexeril.  Call if  not gradually better.  Fever: Fever in the context of a recent trip to [place] in the midst of a coronavirus outbreak. I spoke with Darral Dash at the infection prevention hotline as well as with  ID Dr Luciana Axe on call : as off today, pt does not qualify as a Covid -19 suspect; nevertheless rec to use a mask and check his temp at least qd for few days, see AVS   Today, I spent more than  25  min with the patient: >50% of the time counseling regards fever, making 2 phone calls, coordinating his care.  Answering questions.

## 2018-12-31 ENCOUNTER — Encounter: Payer: Self-pay | Admitting: Internal Medicine

## 2019-01-02 ENCOUNTER — Ambulatory Visit (INDEPENDENT_AMBULATORY_CARE_PROVIDER_SITE_OTHER): Payer: Self-pay | Admitting: Family Medicine

## 2019-01-09 ENCOUNTER — Other Ambulatory Visit: Payer: Self-pay

## 2019-01-09 ENCOUNTER — Encounter (INDEPENDENT_AMBULATORY_CARE_PROVIDER_SITE_OTHER): Payer: Self-pay | Admitting: Family Medicine

## 2019-01-09 ENCOUNTER — Ambulatory Visit (INDEPENDENT_AMBULATORY_CARE_PROVIDER_SITE_OTHER): Payer: 59 | Admitting: Family Medicine

## 2019-01-09 DIAGNOSIS — M5431 Sciatica, right side: Secondary | ICD-10-CM | POA: Diagnosis not present

## 2019-01-09 MED ORDER — TIZANIDINE HCL 2 MG PO TABS: 2.0000 mg | ORAL_TABLET | Freq: Four times a day (QID) | ORAL | 1 refills | Status: DC | PRN

## 2019-01-09 NOTE — Progress Notes (Signed)
I saw and examined the patient with Dr. Jamse Mead and agree with assessment and plan as outlined.  Right sided sciatica, no LBP.  Neuro exam non-focal, piriformis stretch equivocal.  Will try zanaflex, PT.  Lumbar X-Rays and MRI scan if still no improvement.

## 2019-01-09 NOTE — Progress Notes (Signed)
  Jacob Krause - 38 y.o. male MRN [id number]  Date of birth: Aug 22, 1981    SUBJECTIVE:      Chief Complaint: right leg pain  HPI:  38 year old male presents with 3.5 months of right leg pain.  He denies any specific injury.  The pain begins at his right buttock and radiates down the posterior lateral aspect of the leg to the ankle.  He denies any back pain.  His pain is not made worse with any particular activity but it does wax and wane.Marland Kitchen  He does notice the pain more in the evenings.  Since onset, it has recently begun occurring more frequently.  Nothing particular improves his pain.  He was seen by his PCP who prescribed Flexeril which she has not taken.  He has tried various stretching exercises at the gym without improvement.  He denies any localized swelling or erythema.  He does have some decrease sensation in the lateral lower leg at time  He denies any feeling of weakness.  No bowel or bladder symptoms.  No saddle anesthesia.   ROS:     See HPI All other reviewed systems negative.  PERTINENT  PMH / PSH FH / / SH:  Past Medical, Surgical, Social, and Family History Reviewed & Updated in the EMR.    OBJECTIVE: There were no vitals taken for this visit.  Physical Exam:  Vital signs are reviewed.  GEN: Alert and oriented, NAD Pulm: Breathing unlabored PSY: normal mood, congruent affect  MSK: Lumbar spine: - Inspection: no gross deformity or asymmetry, swelling or ecchymosis. No skin changes. - Palpation: No TTP over the spinous processes, paraspinal muscles, or SI joints b/l - ROM: full active ROM of the lumbar spine in flexion and extension without pain - Strength: 5/5 strength of lower extremity in L4-S1 nerve root distributions b/l - Neuro: sensation intact in the L4-S1 nerve root distribution b/l, 2+ L4 and S1 reflexes - Special testing: Positive straight leg raise on the right, negative Stork test  Hip exam: Tenderness over the piriformis on the right with firm  palpation. No TTP over the GT Full range of motion of the hips bilaterally 5/5 strength in the hips bilaterally   ASSESSMENT & PLAN:  1. Right leg pain secondary to sciatica.  His symptoms are more likely related to irritation at the level of the piriformis as opposed to a lumbar radiculopathy. - Referral to physical therapy - Will place on Zanaflex due to drowsiness with Flexeril. -Follow-up as needed

## 2019-01-13 ENCOUNTER — Ambulatory Visit: Payer: 59 | Attending: Family Medicine | Admitting: Physical Therapy

## 2019-01-13 ENCOUNTER — Other Ambulatory Visit: Payer: Self-pay

## 2019-01-13 DIAGNOSIS — M5431 Sciatica, right side: Secondary | ICD-10-CM | POA: Insufficient documentation

## 2019-01-13 DIAGNOSIS — M6281 Muscle weakness (generalized): Secondary | ICD-10-CM

## 2019-01-13 DIAGNOSIS — R29898 Other symptoms and signs involving the musculoskeletal system: Secondary | ICD-10-CM | POA: Diagnosis present

## 2019-01-13 NOTE — Therapy (Signed)
[facility] [address]  [address] [place], [place], [address] Phone: [phone]   Fax:  [phone]  Physical Therapy Evaluation  Patient Details  Name: Valinda HoarSiddhartha Heyer MRN: [id number] Date of Birth: 10/09/1981 Referring Provider (PT): Lavada MesiMichael Hilts, DO   Encounter Date: 01/13/2019  PT End of Session - 01/13/19 1543    Visit Number  1    Number of Visits  12    Date for PT Re-Evaluation  02/24/19    Authorization Type  UHC    PT Start Time  1530    PT Stop Time  1618    PT Time Calculation (min)  48 min    Activity Tolerance  Patient tolerated treatment well    Behavior During Therapy  [facility] for tasks assessed/performed       Past Medical History:  Diagnosis Date  . Obesity 07/27/2016    Past Surgical History:  Procedure Laterality Date  . NO PAST SURGERIES      There were no vitals filed for this visit.   Subjective Assessment - 01/13/19 1535    Subjective  Pt reports pain started in late Nov 2019 but has worsened in last month or so. Reports MD has told him that it it not disc related but feels like it is most likely muscular in origin.    Limitations  Standing;Walking    How long can you stand comfortably?  10-15 minutes    Diagnostic tests  none    Patient Stated Goals  "to resume normal walking and work-out routine w/o pain"    Currently in Pain?  Yes    Pain Score  3    up to 8/10 this morning   Pain Location  Buttocks    Pain Orientation  Right    Pain Descriptors / Indicators  --   "pulling"   Pain Type  Acute pain    Pain Radiating Towards  pain, numbness and tingling down R posterior/lateral to ankle    Pain Onset  More than a month ago   late Nov 2019   Pain Frequency  Constant    Aggravating Factors   prolonged standing, sometimes with walking    Pain Relieving Factors  stretch, reposition, shift weight away from R LE    Effect of Pain on Daily Activities  limits walking tolerance, poor  standing tolerance         [facility] PT Assessment - 01/13/19 1530      Assessment   Medical Diagnosis  R sciatica    Referring Provider (PT)  Lavada MesiMichael Hilts, DO    Onset Date/Surgical Date  --   late Nov 2019   Hand Dominance  Right    Next MD Visit  TBD    Prior Therapy  none      Balance Screen   Has the patient fallen in the past 6 months  No    Has the patient had a decrease in activity level because of a fear of falling?   No    Is the patient reluctant to leave their home because of a fear of falling?   No      Home Environment   Living Environment  Private residence    Type of Home  House    Home Access  Level entry    Home Layout  Two level;Bed/bath upstairs      Prior Function   Level of Independence  Independent    Vocation  Full time employment    Teacher, music - mostly deskwork    Leisure  [employer] 3-4 days/wk, walking 1/2 mile at least 3 days/wk, running 2.5-3 miles at least 1x/wk but limited recently      Observation/Other Assessments   Focus on Therapeutic Outcomes (FOTO)   Lumbar - 48% (52% limitation); Predicted 67% (33% limitation)      ROM / Strength   AROM / PROM / Strength  AROM;Strength      AROM   Overall AROM   Within functional limits for tasks performed    Overall AROM Comments  no pain with overpressure    AROM Assessment Site  Lumbar      Strength   Strength Assessment Site  Hip    Right/Left Hip  Right;Left    Right Hip Flexion  4/5    Right Hip Extension  4-/5    Right Hip ABduction  4/5    Right Hip ADduction  4/5    Left Hip Flexion  4+/5    Left Hip Extension  4/5    Left Hip ABduction  4+/5    Left Hip ADduction  4+/5      Flexibility   Soft Tissue Assessment /Muscle Length  yes    Hamstrings  mod tight R    Piriformis  mild/mod tight R      Palpation   Palpation comment  ttp over R glutes & piriformis      Special Tests    Special Tests  Lumbar    Lumbar Tests  Slump Test;Straight Leg Raise       Slump test   Findings  Positive    Side  Right      Straight Leg Raise   Findings  Positive    Side   Right                Objective measurements completed on examination: See above findings.      [facility] Adult PT Treatment/Exercise - 01/13/19 1530      Exercises   Exercises  Lumbar      Lumbar Exercises: Stretches   Passive Hamstring Stretch  Right;30 seconds;1 rep    Passive Hamstring Stretch Limitations  supine (reproducing radicular pain) & seated (better tolerated)    Piriformis Stretch  Right;30 seconds;1 rep    Piriformis Stretch Limitations  supine KTOS      Manual Therapy   Manual Therapy  Other (comment)    Other Manual Therapy  Instructed pt in self-STM/release for glutes/piriformis with small ball on wall.             PT Education - 01/13/19 1615    Education Details  PT eval findings, anticipated POC & initial HEP    Person(s) Educated  Patient    Methods  Explanation;Demonstration;Handout    Comprehension  Verbalized understanding;Returned demonstration       PT Short Term Goals - 01/13/19 1618      PT SHORT TERM GOAL #1   Title  Independent with initial HEP    Status  New    Target Date  01/27/19        PT Long Term Goals - 01/13/19 1704      PT LONG TERM GOAL #1   Title  Independent with ongoing HEP    Status  New    Target Date  02/24/19      PT LONG TERM GOAL #2   Title  B proximal LE  strength 5/5 w/o pain provocation    Status  New    Target Date  02/24/19      PT LONG TERM GOAL #3   Title  Patient will report ability to stand for >30 min w/o limitation due to R buttock or LE radicular pain    Status  New    Target Date  02/24/19      PT LONG TERM GOAL #4   Title  Patient will report ability to resume walking &/or running w/o restriction due to R buttock or LE radicular pain    Status  New    Target Date  02/24/19      PT LONG TERM GOAL #5   Title  Patient will resume normal work-out routine w/o limitation due  to R buttock or LE radicular pain    Status  New    Target Date  02/24/19             Plan - 01/13/19 1650    Clinical Impression Statement  Wallie is a 38 y/o male who presents to OP PT for R sided sciatica originating in late November 2019 without known MOI. Pt reports pain originates in his R buttock and when pain at it worst it will radiate down his R posterior lateral LE to his ankle. Pain worse with prolonged standing and sometimes with walking causing him to have to shift extra weigh to L LE. Lumbar AROM WFL with no pain on overpressure but proximal LE flexibility limited in hamstrings R>L and to a lesser degree in glutes and piriformis. Positive slump and SLR present on R. Mild proximal LE weakness evident. Zhi will benefit from skilled PT intervention to address the above listed deficits and to allow for resumption of prior activity level and workout routine without limitation due to R LE radiculopathy.    Personal Factors and Comorbidities  --   unremarkable   Examination-Activity Limitations  Stand;Locomotion Level;Transfers    Stability/Clinical Decision Making  Stable/Uncomplicated    Clinical Decision Making  Low    Rehab Potential  Good    PT Frequency  2x / week    PT Duration  6 weeks    PT Treatment/Interventions  Patient/family education;Neuromuscular re-education;Therapeutic exercise;Therapeutic activities;Manual techniques;Passive range of motion;Dry needling;Taping;Spinal Manipulations;Joint Manipulations;Electrical Stimulation;Moist Heat;Functional mobility training;ADLs/Self Care Home Management    PT Next Visit Plan  Review initial HEP    Consulted and Agree with Plan of Care  Patient       Patient will benefit from skilled therapeutic intervention in order to improve the following deficits and impairments:  Pain, Increased muscle spasms, Impaired flexibility, Postural dysfunction, Improper body mechanics, Decreased strength, Decreased activity  tolerance, Difficulty walking  Visit Diagnosis: Sciatica, right side  Muscle weakness (generalized)  Other symptoms and signs involving the musculoskeletal system     Problem List Patient Active Problem List   Diagnosis Date Noted  . Obesity 07/27/2016    Marry Guan, PT, MPT 01/13/2019, 5:31 PM  [facility] [address]  [address] [place], [place], [address] Phone: [phone]   Fax:  [phone]  Name: Brik Bradford MRN: [id number] Date of Birth: 1981/07/09

## 2019-01-16 ENCOUNTER — Ambulatory Visit: Payer: 59 | Admitting: Physical Therapy

## 2019-01-20 ENCOUNTER — Ambulatory Visit: Payer: 59

## 2019-01-23 ENCOUNTER — Other Ambulatory Visit: Payer: Self-pay

## 2019-01-23 ENCOUNTER — Encounter: Payer: 59 | Admitting: Physical Therapy

## 2019-01-23 ENCOUNTER — Ambulatory Visit: Payer: 59

## 2019-01-23 DIAGNOSIS — R29898 Other symptoms and signs involving the musculoskeletal system: Secondary | ICD-10-CM

## 2019-01-23 DIAGNOSIS — M5431 Sciatica, right side: Secondary | ICD-10-CM

## 2019-01-23 DIAGNOSIS — M6281 Muscle weakness (generalized): Secondary | ICD-10-CM

## 2019-01-23 NOTE — Therapy (Signed)
[facility] [address]  [address] [place], [place], [address] Phone: [phone]   Fax:  [phone]  Physical Therapy Treatment  Patient Details  Name: Jacob Krause MRN: [id number] Date of Birth: 10-23-1981 Referring Provider (PT): Lavada Mesi, DO   Encounter Date: 01/23/2019  PT End of Session - 01/23/19 1151    Visit Number  2    Number of Visits  12    Date for PT Re-Evaluation  02/24/19    Authorization Type  UHC    PT Start Time  1146    PT Stop Time  1242   Ended with 10 min moist heat   PT Time Calculation (min)  56 min    Activity Tolerance  Patient tolerated treatment well    Behavior During Therapy  [facility] for tasks assessed/performed       Past Medical History:  Diagnosis Date  . Obesity 07/27/2016    Past Surgical History:  Procedure Laterality Date  . NO PAST SURGERIES      There were no vitals filed for this visit.  Subjective Assessment - 01/23/19 1149    Subjective  Pt. reporting he has most pain in mornings at this point which subsides as day progresses.      Diagnostic tests  none    Patient Stated Goals  "to resume normal walking and work-out routine w/o pain"    Currently in Pain?  No/denies    Pain Score  0-No pain   Pt. noting pain rising to 8/10 in mornings    Pain Location  Buttocks    Pain Orientation  Right    Pain Radiating Towards  pain, numbness and tingling down R posterior/lateral to ankle in mornings    Pain Onset  More than a month ago    Pain Frequency  Constant    Multiple Pain Sites  No                       [facility] Adult PT Treatment/Exercise - 01/23/19 1201      Self-Care   Self-Care  Other Self-Care Comments    Other Self-Care Comments   review of R buttocks/piriformis self-ball release on wall; minor cueing provided for 45 dg turn of body to target tender musculature      Lumbar Exercises: Stretches   Passive Hamstring Stretch  Right;2  reps;30 seconds    Passive Hamstring Stretch Limitations  seated and supine with strap     Single Knee to Chest Stretch  Right;1 rep;30 seconds    Lumbar Stabilization Level 1  3 reps;20 seconds    Lumbar Stabilization Level 1 Limitations  seated green p-ball rollouts     Piriformis Stretch  Right;30 seconds;2 reps    Piriformis Stretch Limitations  supine KTOS      Lumbar Exercises: Aerobic   Nustep  Lvl 5, 6 min (LE)      Lumbar Exercises: Supine   Clam  10 reps;3 seconds    Clam Limitations  Hooklying with red TB at knees     Bridge with clamshell  3 seconds;15 reps    Bridge with [employer] Limitations  red looped TB at knees with isometric hip abd/ER       Modalities   Modalities  Moist Heat      Moist Heat Therapy   Number Minutes Moist Heat  10 Minutes    Moist Heat Location  Hip   R buttocks in  hooklying     Manual Therapy   Manual Therapy  Soft tissue mobilization;Myofascial release    Manual therapy comments  sidelying with R LE resting on bolster     Soft tissue mobilization  STM to R buttocks     Myofascial Release  TPR to R piriformis              PT Education - 01/23/19 1259    Education Details  HEP update; red and yellow looped TB issued to pt.     Person(s) Educated  Patient    Methods  Explanation;Verbal cues;Handout;Demonstration    Comprehension  Verbalized understanding;Returned demonstration;Verbal cues required;Need further instruction       PT Short Term Goals - 01/23/19 1158      PT SHORT TERM GOAL #1   Title  Independent with initial HEP    Status  On-going    Target Date  01/27/19        PT Long Term Goals - 01/23/19 1158      PT LONG TERM GOAL #1   Title  Independent with ongoing HEP    Status  On-going      PT LONG TERM GOAL #2   Title  B proximal LE strength 5/5 w/o pain provocation    Status  On-going      PT LONG TERM GOAL #3   Title  Patient will report ability to stand for >30 min w/o limitation due to R buttock  or LE radicular pain    Status  On-going      PT LONG TERM GOAL #4   Title  Patient will report ability to resume walking &/or running w/o restriction due to R buttock or LE radicular pain    Status  On-going      PT LONG TERM GOAL #5   Title  Patient will resume normal work-out routine w/o limitation due to R buttock or LE radicular pain    Status  On-going            Plan - 01/23/19 1200    Clinical Impression Statement  Pt. reporting no significant change in symptoms since eval however does admit to prolonged sitting while working from home.  Only required minor cueing with HEP review today for proper positioning.  Tolerated progression of LE stretching and proximal hip strengthening activities today with addition of sidelying clam shell and bridge + hip abd/ER into band.  HEP updated with yellow and red looped TB issued to pt.  Ended session with moist heat applied to R buttocks/piriformis to reduce tone.  Will monitor response to updated HEP in coming session.     Examination-Activity Limitations  Stand;Locomotion Level;Transfers    Stability/Clinical Decision Making  Stable/Uncomplicated    Rehab Potential  Good    PT Treatment/Interventions  Patient/family education;Neuromuscular re-education;Therapeutic exercise;Therapeutic activities;Manual techniques;Passive range of motion;Dry needling;Taping;Spinal Manipulations;Joint Manipulations;Electrical Stimulation;Moist Heat;Functional mobility training;ADLs/Self Care Home Management    PT Next Visit Plan  Monitor tolerance to updated HEP     Consulted and Agree with Plan of Care  Patient       Patient will benefit from skilled therapeutic intervention in order to improve the following deficits and impairments:  Pain, Increased muscle spasms, Impaired flexibility, Postural dysfunction, Improper body mechanics, Decreased strength, Decreased activity tolerance, Difficulty walking  Visit Diagnosis: Sciatica, right side  Muscle  weakness (generalized)  Other symptoms and signs involving the musculoskeletal system     Problem List Patient Active Problem List  Diagnosis Date Noted  . Obesity 07/27/2016    Kermit Balo, PTA 01/23/19 1:00 PM    [facility] [address]  [address] [place], [place], [address] Phone: [phone]   Fax:  [phone]  Name: Jacob Krause MRN: [id number] Date of Birth: April 18, 1981

## 2019-01-30 ENCOUNTER — Ambulatory Visit: Payer: 59 | Attending: Family Medicine

## 2019-01-30 DIAGNOSIS — R29898 Other symptoms and signs involving the musculoskeletal system: Secondary | ICD-10-CM | POA: Insufficient documentation

## 2019-01-30 DIAGNOSIS — M6281 Muscle weakness (generalized): Secondary | ICD-10-CM | POA: Insufficient documentation

## 2019-01-30 DIAGNOSIS — M5431 Sciatica, right side: Secondary | ICD-10-CM | POA: Insufficient documentation

## 2019-02-05 ENCOUNTER — Ambulatory Visit: Payer: 59

## 2019-02-09 ENCOUNTER — Other Ambulatory Visit: Payer: Self-pay

## 2019-02-09 ENCOUNTER — Ambulatory Visit: Payer: 59

## 2019-02-09 DIAGNOSIS — M5431 Sciatica, right side: Secondary | ICD-10-CM | POA: Diagnosis not present

## 2019-02-09 DIAGNOSIS — M6281 Muscle weakness (generalized): Secondary | ICD-10-CM | POA: Diagnosis present

## 2019-02-09 DIAGNOSIS — R29898 Other symptoms and signs involving the musculoskeletal system: Secondary | ICD-10-CM

## 2019-02-09 NOTE — Therapy (Signed)
[facility] [address]  [address] [place], [place], [address] Phone: [phone]   Fax:  [phone]  Physical Therapy Treatment  Patient Details  Name: Jacob Krause MRN: [id number] Date of Birth: 25-Dec-1980 Referring Provider (PT): Lavada Mesi, DO   Encounter Date: 02/09/2019  PT End of Session - 02/09/19 1240    Visit Number  3    Number of Visits  12    Date for PT Re-Evaluation  02/24/19    Authorization Type  UHC    PT Start Time  1234    PT Stop Time  1314    PT Time Calculation (min)  40 min    Activity Tolerance  Patient tolerated treatment well    Behavior During Therapy  [facility] for tasks assessed/performed       Past Medical History:  Diagnosis Date  . Obesity 07/27/2016    Past Surgical History:  Procedure Laterality Date  . NO PAST SURGERIES      There were no vitals filed for this visit.  Subjective Assessment - 02/09/19 1237    Subjective  Pt. reporting he is still getting numbness and discomfort standing at times with pain rising to 9/10.  Reports no issues with updated HEP.  Pain remains worst in mornings.      Patient Stated Goals  "to resume normal walking and work-out routine w/o pain"    Currently in Pain?  No/denies    Pain Score  0-No pain   Pain still rising to 9/10 at worst   Pain Location  Buttocks    Pain Orientation  Right    Pain Type  Acute pain    Pain Radiating Towards  Still occasional pain/numbness down R poaterior/lateral LE however denies now    Pain Onset  More than a month ago    Pain Frequency  Intermittent    Aggravating Factors   prolonged standing     Pain Relieving Factors  shifting wt. away from R LE    Multiple Pain Sites  No                       [facility] Adult PT Treatment/Exercise - 02/09/19 1245      Lumbar Exercises: Stretches   Passive Hamstring Stretch  Right;2 reps;30 seconds    Passive Hamstring Stretch Limitations  supine     Single Knee to Chest Stretch  Right;1 rep;30 seconds    Press Ups  10 reps   3" hold    ITB Stretch  Right;1 rep;30 seconds    ITB Stretch Limitations  supine with strap     Piriformis Stretch  Right;30 seconds;3 reps    Piriformis Stretch Limitations  supine KTOS; seated KTOS    Figure 4 Stretch  1 rep;30 seconds    Figure 4 Stretch Limitations  supine     Other Lumbar Stretch Exercise  R glute foam roller x 1 min; cues required for positioning       Lumbar Exercises: Aerobic   Nustep  Lvl 5, 6 min (LE only)      Lumbar Exercises: Standing   Functional Squats  15 reps;3 seconds      Lumbar Exercises: Supine   Bridge with clamshell  15 reps;5 seconds    Bridge with Ball Squeeze Limitations  red looped TB at knees with isometric hip abd/ER       Lumbar Exercises: Sidelying   Clam  Right;Left   x  12 reps    Clam Limitations  red loooped at knees       Lumbar Exercises: Quadruped   Opposite Arm/Leg Raise  Right arm/Left leg;Left arm/Right leg;10 reps    Opposite Arm/Leg Raise Limitations  Cues for proper hold times required              PT Education - 02/09/19 1339    Education Details  HEP update; glute foam roller, quadruped LE/UE raise     Person(s) Educated  Patient    Methods  Explanation;Demonstration;Verbal cues;Handout    Comprehension  Verbalized understanding;Returned demonstration;Verbal cues required;Need further instruction       PT Short Term Goals - 02/09/19 1241      PT SHORT TERM GOAL #1   Title  Independent with initial HEP    Status  Achieved    Target Date  01/27/19        PT Long Term Goals - 01/23/19 1158      PT LONG TERM GOAL #1   Title  Independent with ongoing HEP    Status  On-going      PT LONG TERM GOAL #2   Title  B proximal LE strength 5/5 w/o pain provocation    Status  On-going      PT LONG TERM GOAL #3   Title  Patient will report ability to stand for >30 min w/o limitation due to R buttock or LE radicular pain     Status  On-going      PT LONG TERM GOAL #4   Title  Patient will report ability to resume walking &/or running w/o restriction due to R buttock or LE radicular pain    Status  On-going      PT LONG TERM GOAL #5   Title  Patient will resume normal work-out routine w/o limitation due to R buttock or LE radicular pain    Status  On-going            Plan - 02/09/19 1242    Clinical Impression Statement  Pt. reporting he feels duration of pain has somewhat improved however R buttocks/LE radicular symptoms/pain still rising to 9/10 at times after prolonged standing and in mornings.  Pt. tolerated all gentle lumbopelvic strengthening and ROM activities well today.  Reports no issues with updated HEP.  Pt. noted he preferred R glute foam roll to tennis ball release on wall thus updated HEP with this.  Will continue to progress toward goals.      Examination-Activity Limitations  Stand;Locomotion Level;Transfers    Stability/Clinical Decision Making  Stable/Uncomplicated    Rehab Potential  Good    PT Treatment/Interventions  Patient/family education;Neuromuscular re-education;Therapeutic exercise;Therapeutic activities;Manual techniques;Passive range of motion;Dry needling;Taping;Spinal Manipulations;Joint Manipulations;Electrical Stimulation;Moist Heat;Functional mobility training;ADLs/Self Care Home Management    PT Next Visit Plan  Monitor tolerance to updated HEP     Consulted and Agree with Plan of Care  Patient       Patient will benefit from skilled therapeutic intervention in order to improve the following deficits and impairments:  Pain, Increased muscle spasms, Impaired flexibility, Postural dysfunction, Improper body mechanics, Decreased strength, Decreased activity tolerance, Difficulty walking  Visit Diagnosis: Sciatica, right side  Muscle weakness (generalized)  Other symptoms and signs involving the musculoskeletal system     Problem List Patient Active Problem List    Diagnosis Date Noted  . Obesity 07/27/2016    Kermit Balo, PTA 02/09/19 3:31 PM    [place] [facility] [address]  [address] [place], [place], [address] Phone: [phone]   Fax:  [phone]  Name: Jacob Krause MRN: [id number] Date of Birth: 12/07/1980

## 2019-02-10 ENCOUNTER — Encounter (INDEPENDENT_AMBULATORY_CARE_PROVIDER_SITE_OTHER): Payer: Self-pay | Admitting: Family Medicine

## 2019-02-10 DIAGNOSIS — M5431 Sciatica, right side: Secondary | ICD-10-CM

## 2019-02-12 ENCOUNTER — Ambulatory Visit: Payer: 59

## 2019-02-12 ENCOUNTER — Other Ambulatory Visit: Payer: Self-pay

## 2019-02-12 DIAGNOSIS — M5431 Sciatica, right side: Secondary | ICD-10-CM

## 2019-02-12 DIAGNOSIS — R29898 Other symptoms and signs involving the musculoskeletal system: Secondary | ICD-10-CM

## 2019-02-12 DIAGNOSIS — M6281 Muscle weakness (generalized): Secondary | ICD-10-CM

## 2019-02-12 NOTE — Therapy (Addendum)
[facility] [address]  [place], [place], [address] Phone: [phone]   Fax:  [phone]  Physical Therapy Treatment / Discharge Summary  Patient Details  Name: Jacob Krause MRN: [id number] Date of Birth: [birth year] Referring Provider (PT): Eunice Blase, DO   Encounter Date: 02/12/2019  PT End of Session - 02/12/19 1324    Visit Number  4    Number of Visits  12    Date for PT Re-Evaluation  02/24/19    Authorization Type  [employer]    PT Start Time  2395    PT Stop Time  1400    PT Time Calculation (min)  45 min    Activity Tolerance  Patient tolerated treatment well    Behavior During Therapy  [facility] for tasks assessed/performed       Past Medical History:  Diagnosis Date  . Obesity 07/27/2016    Past Surgical History:  Procedure Laterality Date  . NO PAST SURGERIES      There were no vitals filed for this visit.  Subjective Assessment - 02/12/19 1319    Subjective  Pt. reporting the frequency of his pain is somewhat improved however intensity of R buttocks/LE pain is unchanged with prolonted standing.      Patient Stated Goals  "to resume normal walking and work-out routine w/o pain"    Currently in Pain?  Yes    Pain Score  8     Pain Location  Buttocks    Pain Orientation  Right    Pain Descriptors / Indicators  Radiating;Numbness    Pain Radiating Towards  Pt. noting consistent numbness into R lateral calf, radiating pain down from R hip down R lateral thigh.      Pain Frequency  Intermittent    Aggravating Factors   prolonged standing, prolonged walking    Multiple Pain Sites  No                       [facility] Adult PT Treatment/Exercise - 02/12/19 1334      Lumbar Exercises: Stretches   Passive Hamstring Stretch  Right;2 reps;30 seconds    Passive Hamstring Stretch Limitations  supine     Single Knee to Chest Stretch  Right;1 rep;30 seconds    Piriformis Stretch   Right;30 seconds;3 reps    Piriformis Stretch Limitations  supine KTOS; seated KTOS    Figure 4 Stretch  1 rep;30 seconds    Figure 4 Stretch Limitations  supine - towel       Lumbar Exercises: Aerobic   Recumbent Bike  Lvl 2, 7 min       Lumbar Exercises: Sidelying   Clam  Right;Left;10 reps    Clam Limitations  red loooped at knees       Moist Heat Therapy   Number Minutes Moist Heat  10 Minutes    Moist Heat Location  Hip   applied to R buttocks in sidelying with R LE resting on bols     Manual Therapy   Manual Therapy  Soft tissue mobilization;Myofascial release;Passive ROM;Taping    Manual therapy comments  prone and sidelying with R LE resting on bolster     Soft tissue mobilization  STM to R glute med, glute max, piriformis    Myofascial Release  TPR to R glute med/piriformis     Passive ROM  R manual KTOS, ITB, SKTC, HS stretch with therapist x 30 sec  Kinesiotex  IT sales professional  R piriformis K-taping pattern: I-strip along length of R piriformis (30% stretch); short cross strip at point of most ternderness (30% stretch); applied in L sidelying with R LE resting on bolster                PT Short Term Goals - 02/09/19 1241      PT SHORT TERM GOAL #1   Title  Independent with initial HEP    Status  Achieved    Target Date  01/27/19        PT Long Term Goals - 01/23/19 1158      PT LONG TERM GOAL #1   Title  Independent with ongoing HEP    Status  On-going      PT LONG TERM GOAL #2   Title  B proximal LE strength 5/5 w/o pain provocation    Status  On-going      PT LONG TERM GOAL #3   Title  Patient will report ability to stand for >30 min w/o limitation due to R buttock or LE radicular pain    Status  On-going      PT LONG TERM GOAL #4   Title  Patient will report ability to resume walking &/or running w/o restriction due to R buttock or LE radicular pain    Status  On-going      PT LONG TERM GOAL #5   Title   Patient will resume normal work-out routine w/o limitation due to R buttock or LE radicular pain    Status  On-going            Plan - 02/12/19 1324    Clinical Impression Statement  Saud reporting he felt fine after last session however woke up this morning with increased R buttocks/R LE radiating pain + numbness in R lateral calf muscle without known trigger.  This pain subsided as morning continued however lingers some while walking into clinic today.  Session focused on MT to address increased tone/tightness palpable in R glute/piriformis/glute med which responded well to MT.  Applied trial of K-taping with piriformis taping pattern at 30% stretch for hopeful improvement in pain and tissue quality.  Ended session with gentle proximal hip strengthening and application of moist heat to R hip for reduction in tone.  Will monitor response to taping at upcoming session.  Pt. reporting he is performing HEP daily.      Examination-Activity Limitations  Stand;Locomotion Level;Transfers    Stability/Clinical Decision Making  Stable/Uncomplicated    PT Treatment/Interventions  Patient/family education;Neuromuscular re-education;Therapeutic exercise;Therapeutic activities;Manual techniques;Passive range of motion;Dry needling;Taping;Spinal Manipulations;Joint Manipulations;Electrical Stimulation;Moist Heat;Functional mobility training;ADLs/Self Care Home Management    PT Next Visit Plan  Monitor tolerance to trial of taping to R buttocks; gentle proximal hip strengthening; LE flexibility; modalities as indicated    Consulted and Agree with Plan of Care  Patient       Patient will benefit from skilled therapeutic intervention in order to improve the following deficits and impairments:  Pain, Increased muscle spasms, Impaired flexibility, Postural dysfunction, Improper body mechanics, Decreased strength, Decreased activity tolerance, Difficulty walking  Visit Diagnosis: Sciatica, right  side  Muscle weakness (generalized)  Other symptoms and signs involving the musculoskeletal system     Problem List Patient Active Problem List   Diagnosis Date Noted  . Obesity 07/27/2016    Bess Harvest, PTA 02/12/19 3:18 PM   [facility]  [facility] [address]  [place], [place], [address] Phone: [phone]   Fax:  [phone]  Name: Enrrique Mierzwa MRN: [id number] Date of Birth: [birth year]  PHYSICAL THERAPY DISCHARGE SUMMARY  Visits from Start of Care: 4  Current functional level related to goals / functional outcomes:   Refer to above clinical impression for status as of last visit on 02/12/2019. Patient has not returned in > 30 days, therefore will proceed with discharge from PT for this episode.   Remaining deficits:   As above.   Education / Equipment:   HEP  Plan: Patient agrees to discharge.  Patient goals were partially met. Patient is being discharged due to not returning since the last visit.  ?????     Percival Spanish, PT, MPT 04/10/19, 8:36 AM  [facility] [address]  [place], [place], [address] Phone: [phone]   Fax:  [phone]

## 2019-03-16 ENCOUNTER — Other Ambulatory Visit: Payer: Self-pay

## 2019-03-16 ENCOUNTER — Ambulatory Visit
Admission: RE | Admit: 2019-03-16 | Discharge: 2019-03-16 | Disposition: A | Discharge status: Home / Self Care | Payer: 59 | Source: Ambulatory Visit | Attending: Family Medicine | Admitting: Family Medicine

## 2019-03-16 DIAGNOSIS — M5431 Sciatica, right side: Secondary | ICD-10-CM

## 2019-03-17 ENCOUNTER — Telehealth: Payer: Self-pay | Admitting: Family Medicine

## 2019-03-17 DIAGNOSIS — M5431 Sciatica, right side: Secondary | ICD-10-CM

## 2019-03-17 NOTE — Telephone Encounter (Signed)
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  A large right-sided disc protrusion compressing the L5 nerve.  (This is the most likely cause of your pain).  Treatment options include:  - Trial of epidural steroid injection. - Trial of chiropractic. - Referral to surgeon.

## 2019-03-18 NOTE — Addendum Note (Signed)
Addended by: Lillia Carmel on: 03/18/2019 07:51 AM   Modules accepted: Orders

## 2019-03-18 NOTE — Telephone Encounter (Signed)
Patient is scheduled for 03/27/2019 with Dr. Otelia Sergeant

## 2019-03-27 ENCOUNTER — Ambulatory Visit: Payer: 59 | Admitting: Specialist

## 2019-04-23 ENCOUNTER — Ambulatory Visit: Payer: Self-pay

## 2019-04-23 ENCOUNTER — Other Ambulatory Visit: Payer: Self-pay

## 2019-04-23 ENCOUNTER — Ambulatory Visit (INDEPENDENT_AMBULATORY_CARE_PROVIDER_SITE_OTHER): Payer: 59 | Admitting: Specialist

## 2019-04-23 ENCOUNTER — Encounter: Payer: Self-pay | Admitting: Specialist

## 2019-04-23 VITALS — BP 125/82 | HR 66 | Ht 73.0 in | Wt 200.0 lb

## 2019-04-23 DIAGNOSIS — M5116 Intervertebral disc disorders with radiculopathy, lumbar region: Secondary | ICD-10-CM | POA: Diagnosis not present

## 2019-04-23 DIAGNOSIS — M5431 Sciatica, right side: Secondary | ICD-10-CM

## 2019-04-23 NOTE — Progress Notes (Addendum)
Office Visit Note   Patient: Jacob Krause           Date of Birth: 11/02/1980           MRN: [id number] Visit Date: 04/23/2019              Requested by: Lavada MesiHilts, Michael, MD [address] [place],  [place] [id number] PCP: Sharlene DoryWendling, Nicholas Paul, DO   Assessment & Plan: Visit Diagnoses:  1. Sciatica, right side   2. Lumbar disc herniation with radiculopathy     Plan: Avoid bending, stooping and avoid lifting weights greater than 10 lbs. Avoid prolong standing and walking. Avoid frequent bending and stooping  No lifting greater than 10 lbs. May use ice or moist heat for pain. Weight loss is of benefit. Handicap license is approved. Please call and we would arrange for Dr. Wingo BlasNewton's secretary/Assistant will call to arrange for epidural steroid injection  If you are considering surgery call and we would schedule intervention. Risks of surgery NWG:NFAOZare:Avoid bending, stooping and avoid lifting weights greater than 10 lbs. Avoid prolong standing and walking.   Surgery recommended is a right one level lumbar microdiscectomy L4-5 using microscope. Take hydrocodone for for pain. Risk of surgery includes risk of infection 1 in 200 patients, bleeding <1/2% chance you would need a transfusion.   Risk to the nerves is one in 10,000.     Follow-Up Instructions: Return in about 4 years (around 04/23/2023).   Orders:  Orders Placed This Encounter  Procedures  . XR Lumb Spine Flex&Ext Only   No orders of the defined types were placed in this encounter.     Procedures: No procedures performed   Clinical Data: No additional findings.   Subjective: Chief Complaint  Patient presents with  . Lower Back - Pain    38 year old male with nearly 6 month history of back pain with radiation into the right buttock and lateral right leg. Pain started following a high intensity work out at [employer] [place]. He has been doing the high intensity workout for about 2 years now. He is  married, at first mild pain with a little numbness. He was able to continue to workout but  Since March he has had increasing pain in the right buttock and right leg and symptoms with standing and walking Into the right leg. No bowel or bladder difficulties. Can not walk more than 5 min in the park due to increased right leg pain. Some improvement with sitting, standing and cooking the pain kicks in again after 5-10 minutes.   Review of Systems  Constitutional: Positive for unexpected weight change. Negative for activity change, appetite change, chills, diaphoresis, fatigue and fever.  HENT: Negative.  Negative for congestion, dental problem, drooling, ear discharge, ear pain, facial swelling, hearing loss, mouth sores, nosebleeds, postnasal drip, rhinorrhea, sinus pressure, sinus pain, sneezing, sore throat, tinnitus, trouble swallowing and voice change.   Eyes: Negative.  Negative for photophobia, pain, discharge, redness, itching and visual disturbance.  Respiratory: Negative.  Negative for apnea, cough, choking, chest tightness, shortness of breath, wheezing and stridor.   Cardiovascular: Negative for chest pain, palpitations and leg swelling.  Gastrointestinal: Negative for abdominal distention, abdominal pain, anal bleeding, blood in stool, constipation, diarrhea, nausea, rectal pain and vomiting.  Endocrine: Negative for cold intolerance, heat intolerance, polydipsia, polyphagia and polyuria.  Genitourinary: Positive for frequency and urgency. Negative for difficulty urinating, dysuria, enuresis and flank pain.  Musculoskeletal: Positive for back pain and gait problem.  Negative for joint swelling, myalgias, neck pain and neck stiffness.  Skin: Negative for color change, pallor, rash and wound.  Allergic/Immunologic: Negative for environmental allergies, food allergies and immunocompromised state.  Neurological: Positive for weakness and numbness. Negative for dizziness, tremors, seizures,  syncope, facial asymmetry, speech difficulty, light-headedness and headaches.  Hematological: Negative for adenopathy. Does not bruise/bleed easily.  Psychiatric/Behavioral: Negative for agitation, behavioral problems, confusion, decreased concentration, dysphoric mood, hallucinations, self-injury, sleep disturbance and suicidal ideas. The patient is not nervous/anxious and is not hyperactive.      Objective: Vital Signs: BP 125/82 (BP Location: Left Arm, Patient Position: Sitting)   Pulse 66   Ht 6\' 1"  (1.854 m)   Wt 200 lb (90.7 kg)   BMI 26.39 kg/m   Physical Exam Constitutional:      Appearance: He is well-developed.  HENT:     Head: Normocephalic and atraumatic.  Eyes:     Pupils: Pupils are equal, round, and reactive to light.  Neck:     Musculoskeletal: Normal range of motion and neck supple.  Pulmonary:     Effort: Pulmonary effort is normal.     Breath sounds: Normal breath sounds.  Abdominal:     General: Bowel sounds are normal.     Palpations: Abdomen is soft.  Musculoskeletal: Normal range of motion.  Skin:    General: Skin is warm and dry.  Neurological:     Mental Status: He is alert and oriented to person, place, and time.  Psychiatric:        Behavior: Behavior normal.        Thought Content: Thought content normal.        Judgment: Judgment normal.     Ortho Exam  Specialty Comments:  No specialty comments available.  Imaging: No results found.   PMFS History: Patient Active Problem List   Diagnosis Date Noted  . Obesity 07/27/2016   Past Medical History:  Diagnosis Date  . Obesity 07/27/2016    Family History  Problem Relation Age of Onset  . Cancer Father   . Diabetes Father   . Diabetes Paternal Grandmother   . Heart attack Paternal Grandfather     Past Surgical History:  Procedure Laterality Date  . NO PAST SURGERIES     Social History   Occupational History  . Not on file  Tobacco Use  . Smoking status: Never Smoker  .  Smokeless tobacco: Never Used  Substance and Sexual Activity  . Alcohol use: Yes    Comment: social  . Drug use: No  . Sexual activity: Not on file

## 2019-04-23 NOTE — Patient Instructions (Signed)
Avoid frequent bending and stooping  No lifting greater than 10 lbs. May use ice or moist heat for pain. Weight loss is of benefit. Handicap license is approved. Please call and we would arrange for Dr. Romona Curls secretary/Assistant will call to arrange for epidural steroid injection  If you are considering surgery call and we would schedule intervention. Risks of surgery IAX:KPVVZ bending, stooping and avoid lifting weights greater than 10 lbs. Avoid prolong standing and walking.   Surgery recommended is a right one level lumbar microdiscectomy L4-5 using microscope. Take hydrocodone for for pain. Risk of surgery includes risk of infection 1 in 200 patients, bleeding <1/2% chance you would need a transfusion.   Risk to the nerves is one in 10,000.

## 2019-05-22 ENCOUNTER — Ambulatory Visit: Payer: 59 | Admitting: Specialist

## 2020-09-29 ENCOUNTER — Other Ambulatory Visit: Payer: Self-pay

## 2020-09-29 ENCOUNTER — Encounter (HOSPITAL_BASED_OUTPATIENT_CLINIC_OR_DEPARTMENT_OTHER): Payer: Self-pay

## 2020-09-29 ENCOUNTER — Emergency Department (HOSPITAL_BASED_OUTPATIENT_CLINIC_OR_DEPARTMENT_OTHER)
Admission: EM | Admit: 2020-09-29 | Discharge: 2020-09-29 | Disposition: A | Discharge status: Home / Self Care | Payer: 59 | Attending: Emergency Medicine | Admitting: Emergency Medicine

## 2020-09-29 DIAGNOSIS — H1089 Other conjunctivitis: Secondary | ICD-10-CM | POA: Insufficient documentation

## 2020-09-29 DIAGNOSIS — H5789 Other specified disorders of eye and adnexa: Secondary | ICD-10-CM | POA: Diagnosis present

## 2020-09-29 DIAGNOSIS — H109 Unspecified conjunctivitis: Secondary | ICD-10-CM

## 2020-09-29 MED ORDER — ERYTHROMYCIN 5 MG/GM OP OINT
TOPICAL_OINTMENT | Freq: Once | OPHTHALMIC | Status: AC
  Administered 2020-09-29: 1 via OPHTHALMIC
  Filled 2020-09-29: qty 3.5

## 2020-09-29 MED ORDER — ERYTHROMYCIN 5 MG/GM OP OINT: TOPICAL_OINTMENT | OPHTHALMIC | 0 refills | Status: DC

## 2020-09-29 NOTE — ED Triage Notes (Signed)
Pt c/o redness, drainage, tearing right eye x 3 days-denies injury-states is swollen closed in the am-NAD-steady gait

## 2020-09-29 NOTE — ED Provider Notes (Signed)
[facility] EMERGENCY DEPARTMENT Provider Note   CSN: [id number] Arrival date & time: 09/29/20  1232     History Chief Complaint  Patient presents with  . Conjunctivitis    Jacob Krause is a 39 y.o. male.  HPI   Patient is a 39 year old male who presents to the emergency department due to right eye redness, pain, itchiness.  His symptoms started a few days ago.  No history of similar symptoms.  No sick contacts or relatives in his household with similar symptoms.  He reports increased lacrimation as well as white discharge from the right eye that appears to be worse in the morning upon waking.  Symptoms have not improved over the past few days and he reports a swollen lymph node in the preauricular space on the right side so he became concerned and came to the emergency department.  Denies ear pain, ear discharge, rhinorrhea, sore throat, cough, congestion, photophobia, visual disturbance.     Past Medical History:  Diagnosis Date  . Obesity 07/27/2016    Patient Active Problem List   Diagnosis Date Noted  . Obesity 07/27/2016    Past Surgical History:  Procedure Laterality Date  . NO PAST SURGERIES         Family History  Problem Relation Age of Onset  . Cancer Father   . Diabetes Father   . Diabetes Paternal Grandmother   . Heart attack Paternal Grandfather     Social History   Tobacco Use  . Smoking status: Never Smoker  . Smokeless tobacco: Never Used  Vaping Use  . Vaping Use: Never used  Substance Use Topics  . Alcohol use: Yes    Comment: social  . Drug use: No    Home Medications Prior to Admission medications   Medication Sig Start Date End Date Taking? Authorizing Provider  cyclobenzaprine (FLEXERIL) 10 MG tablet Take 1 tablet (10 mg total) by mouth at bedtime as needed for muscle spasms. Patient not taking: Reported on 01/13/2019 12/30/18   Wanda Plump, MD  tiZANidine (ZANAFLEX) 2 MG tablet Take 1-2 tablets (2-4 mg total) by  mouth every 6 (six) hours as needed for muscle spasms. 01/09/19   Hilts, Casimiro Needle, MD    Allergies    Patient has no known allergies.  Review of Systems   Review of Systems  Constitutional: Negative for chills, fatigue and fever.  HENT: Negative for congestion, ear discharge, ear pain and sore throat.   Eyes: Positive for pain, discharge, redness and itching. Negative for photophobia and visual disturbance.   Physical Exam Updated Vital Signs BP 130/80 (BP Location: Left Arm)   Pulse 72   Temp 98.6 F (37 C) (Oral)   Resp 18   Ht 6' (1.829 m)   Wt 100.2 kg   SpO2 100%   BMI 29.97 kg/m   Physical Exam Vitals and nursing note reviewed.  Constitutional:      General: He is not in acute distress.    Appearance: He is well-developed.  HENT:     Head: Normocephalic and atraumatic.     Right Ear: Tympanic membrane, ear canal and external ear normal. There is no impacted cerumen.     Left Ear: Tympanic membrane, ear canal and external ear normal. There is no impacted cerumen.     Ears:     Comments: Bilateral ears, EACs, TMs appear normal.  Nonbulging.  No discharge.  No pain with manipulation of the ear.    Nose: Nose normal.  Mouth/Throat:     Mouth: Mucous membranes are moist.     Pharynx: Oropharynx is clear. No oropharyngeal exudate.     Comments: Mild erythema noted in the posterior oropharynx.  Uvula midline.  No exudates.  Readily handling secretions.  No hot potato voice. Eyes:     General: No scleral icterus.       Right eye: Discharge present. No hordeolum.        Left eye: No discharge or hordeolum.     Extraocular Movements: Extraocular movements intact.     Right eye: Normal extraocular motion and no nystagmus.     Left eye: Normal extraocular motion and no nystagmus.     Conjunctiva/sclera:     Right eye: Right conjunctiva is injected. No chemosis, exudate or hemorrhage.    Left eye: Left conjunctiva is not injected. No chemosis, exudate or hemorrhage.     Comments: Pupils are equal, round, and reactive to light.  Extraocular movements are intact.  Visual fields are intact.  Right eye is diffusely injected.  Increased lacrimation.  Small amount of white discharge noted along the inferior lid.  Small amount of edema noted along the superior lid.  No surrounding cellulitis.  Palpable lymphadenopathy noted along the preauricular region on the right side.  No lymphadenopathy noted on the left.  Neck:     Trachea: No tracheal deviation.     Comments: Neck is supple with no cervical lymphadenopathy noted. Cardiovascular:     Rate and Rhythm: Normal rate.  Pulmonary:     Effort: Pulmonary effort is normal. No respiratory distress.     Breath sounds: No stridor.  Abdominal:     General: There is no distension.  Musculoskeletal:        General: No swelling or deformity.     Cervical back: Normal range of motion and neck supple.  Lymphadenopathy:     Cervical: No cervical adenopathy.  Skin:    General: Skin is warm and dry.     Findings: No rash.  Neurological:     Mental Status: He is alert.     Cranial Nerves: Cranial nerve deficit: no gross deficits.     ED Results / Procedures / Treatments   Labs (all labs ordered are listed, but only abnormal results are displayed) Labs Reviewed - No data to display  EKG None  Radiology No results found.  Procedures Procedures (including critical care time)  Medications Ordered in ED Medications - No data to display  ED Course  I have reviewed the triage vital signs and the nursing notes.  Pertinent labs & imaging results that were available during my care of the patient were reviewed by me and considered in my medical decision making (see chart for details).    MDM Rules/Calculators/A&P                          Patient is a 40 year old male who presents today with what appears to be a bacterial conjunctivitis in the right eye.  He does have some increased tearing as well as a small  amount of white discharge from the eye.  Diffusely injected.  Denies any visual changes, photophobia.  No surrounding cellulitis noted in the region.  He does have some palpable lymphadenopathy in the preauricular space on the right but his right ear, right EAC, right TM all appears normal.  Patient has no other physical complaints besides the right eye pain.  Patient works at  home on a computer and denies any recent wood or metal work or any cause for possible foreign body.  He wears glasses but does not wear contact lenses.  We will place patient on topical erythromycin for the right eye.  First dose in the ED.  We will also give ophthalmology follow-up if symptoms do not improve.  Return to the ED with worsening symptoms.  He was amenable with this plan.  We will give an additional prescription for erythromycin.  His questions were answered and he was amicable at the time of discharge.  Final Clinical Impression(s) / ED Diagnoses Final diagnoses:  Bacterial conjunctivitis   Rx / DC Orders ED Discharge Orders         Ordered    erythromycin ophthalmic ointment        09/29/20 1435           Placido Sou, PA-C 09/29/20 1440    Arby Barrette, MD 10/07/20 1542

## 2020-09-29 NOTE — Discharge Instructions (Addendum)
I am prescribing a medication called erythromycin placed on the right eye.  This was the same medication you were given in the emergency department today.  You are going to do this 4 times a day for the next 7 days.  I have given you a referral to an ophthalmologist in the area.  Please call them in 3 to 4 days if your symptoms not improved.  Return to the emergency department with worsening symptoms.  It was a pleasure to meet you.

## 2020-10-03 ENCOUNTER — Emergency Department (HOSPITAL_BASED_OUTPATIENT_CLINIC_OR_DEPARTMENT_OTHER)
Admission: EM | Admit: 2020-10-03 | Discharge: 2020-10-03 | Disposition: A | Discharge status: Home / Self Care | Payer: 59 | Attending: Emergency Medicine | Admitting: Emergency Medicine

## 2020-10-03 ENCOUNTER — Encounter (HOSPITAL_BASED_OUTPATIENT_CLINIC_OR_DEPARTMENT_OTHER): Payer: Self-pay | Admitting: *Deleted

## 2020-10-03 ENCOUNTER — Other Ambulatory Visit: Payer: Self-pay

## 2020-10-03 DIAGNOSIS — H1033 Unspecified acute conjunctivitis, bilateral: Secondary | ICD-10-CM

## 2020-10-03 DIAGNOSIS — H109 Unspecified conjunctivitis: Secondary | ICD-10-CM | POA: Insufficient documentation

## 2020-10-03 MED ORDER — OFLOXACIN 0.3 % OP SOLN: 2.0000 [drp] | Freq: Four times a day (QID) | OPHTHALMIC | 0 refills | Status: DC

## 2020-10-03 NOTE — Discharge Instructions (Addendum)
Stop using erythromycin ointment.  Begin using Ocuflox drops as prescribed.  Follow-up with ophthalmology if your symptoms or not improving in the next 2 days.  The contact information for Dr. Wynelle Link has been provided in this discharge summary for you to call and make these arrangements.  Return to the ER in the meantime if symptoms significantly worsen or change.

## 2020-10-03 NOTE — ED Triage Notes (Signed)
Pt was here 4 days ago for eye iritation.  Was prescribed with eye ointment without any relief.  Pt stated that it is worst than it was. Right eye is red with drainage and eyelid is swollen.

## 2020-10-03 NOTE — ED Provider Notes (Signed)
[facility] EMERGENCY DEPARTMENT Provider Note   CSN: [id number] Arrival date & time: 10/03/20  2694     History Chief Complaint  Patient presents with  . Eye Iritation    Jacob Krause is a 39 y.o. male.  Patient is a 39 year old male with no significant past medical history.  He presents today with complaints of right eye irritation, redness, and drainage.  He was seen here 4 days ago and diagnosed with conjunctivitis.  He was given erythromycin ointment, however does not seem to be improving.  He states that he is now having similar symptoms to the left eye.  He denies any fevers or chills.  He denies any vision loss.  The history is provided by the patient.       Past Medical History:  Diagnosis Date  . Obesity 07/27/2016    Patient Active Problem List   Diagnosis Date Noted  . Obesity 07/27/2016    Past Surgical History:  Procedure Laterality Date  . NO PAST SURGERIES         Family History  Problem Relation Age of Onset  . Cancer Father   . Diabetes Father   . Diabetes Paternal Grandmother   . Heart attack Paternal Grandfather     Social History   Tobacco Use  . Smoking status: Never Smoker  . Smokeless tobacco: Never Used  Vaping Use  . Vaping Use: Never used  Substance Use Topics  . Alcohol use: Yes    Comment: social  . Drug use: No    Home Medications Prior to Admission medications   Medication Sig Start Date End Date Taking? Authorizing Provider  cyclobenzaprine (FLEXERIL) 10 MG tablet Take 1 tablet (10 mg total) by mouth at bedtime as needed for muscle spasms. Patient not taking: Reported on 01/13/2019 12/30/18   Wanda Plump, MD  erythromycin ophthalmic ointment Place a 1/2 inch ribbon of ointment into the lower eyelid. 09/29/20   Placido Sou, PA-C  tiZANidine (ZANAFLEX) 2 MG tablet Take 1-2 tablets (2-4 mg total) by mouth every 6 (six) hours as needed for muscle spasms. 01/09/19   Hilts, Casimiro Needle, MD    Allergies      Patient has no known allergies.  Review of Systems   Review of Systems  All other systems reviewed and are negative.   Physical Exam Updated Vital Signs BP 101/84 (BP Location: Right Arm)   Pulse 71   Temp 98.2 F (36.8 C) (Oral)   Resp 16   Ht 6' (1.829 m)   Wt 100.2 kg   SpO2 96%   BMI 29.97 kg/m   Physical Exam Vitals and nursing note reviewed.  Constitutional:      General: He is not in acute distress.    Appearance: Normal appearance. He is not ill-appearing.  HENT:     Head: Normocephalic and atraumatic.  Eyes:     Comments: There is conjunctival injection of the right eye.  The cornea appears clear and pupil is reactive.  There is clear drainage, but no purulent material.    There is conjunctival injection of the left eye, but to a lesser extent.  Pulmonary:     Effort: Pulmonary effort is normal.  Skin:    General: Skin is warm and dry.  Neurological:     Mental Status: He is alert.     ED Results / Procedures / Treatments   Labs (all labs ordered are listed, but only abnormal results are displayed) Labs Reviewed - No  data to display  EKG None  Radiology No results found.  Procedures Procedures (including critical care time)  Medications Ordered in ED Medications - No data to display  ED Course  I have reviewed the triage vital signs and the nursing notes.  Pertinent labs & imaging results that were available during my care of the patient were reviewed by me and considered in my medical decision making (see chart for details).  Patient presenting here with complaints of eye redness and irritation.  He has conjunctivitis that is either bacterial and he is on the wrong antibiotic or is not improving because the infection is viral.  At this point, I will change the antibiotic and have him follow-up with ophthalmology if not improving in the next 2 days.  MDM Rules/Calculators/A&P    Final Clinical Impression(s) / ED Diagnoses Final  diagnoses:  None    Rx / DC Orders ED Discharge Orders    None       Geoffery Lyons, MD 10/03/20 [phone]

## 2020-10-05 IMAGING — CR LUMBAR SPINE - 2-3 VIEW
3 series · 3 of 3 positions shown · non-contrast
Comparison: Lumbar MRI 03/16/2019

CLINICAL DATA: Right-sided sciatica

EXAM:
LUMBAR SPINE - 2-3 VIEW

[w lumbar spine ap]
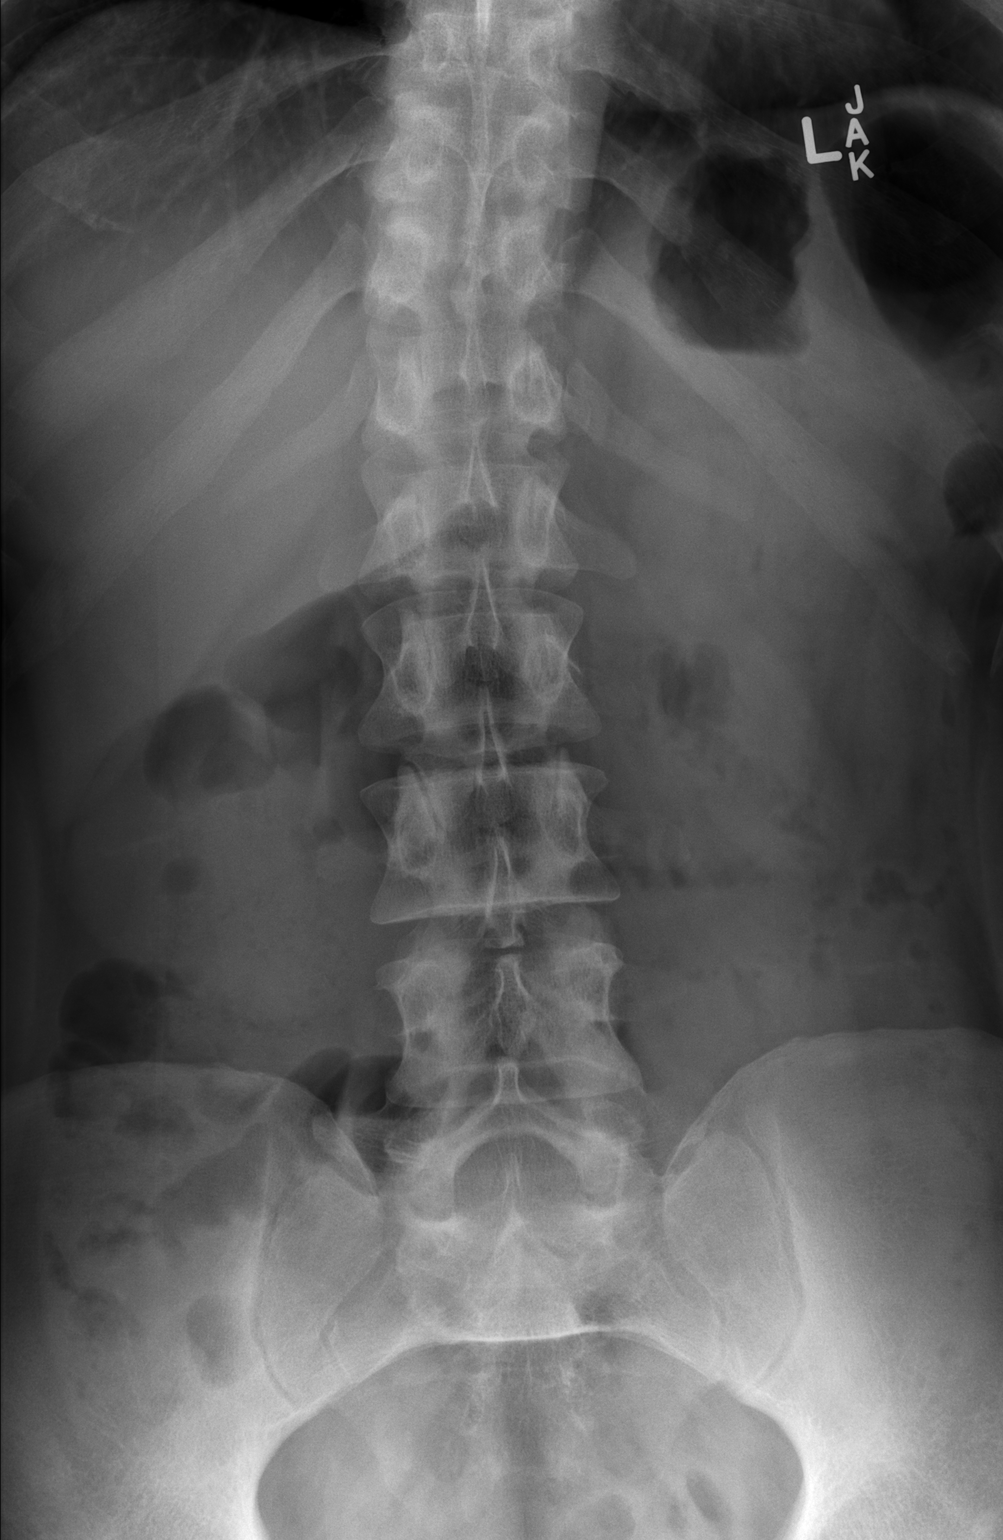

[w lumbar spine lat]
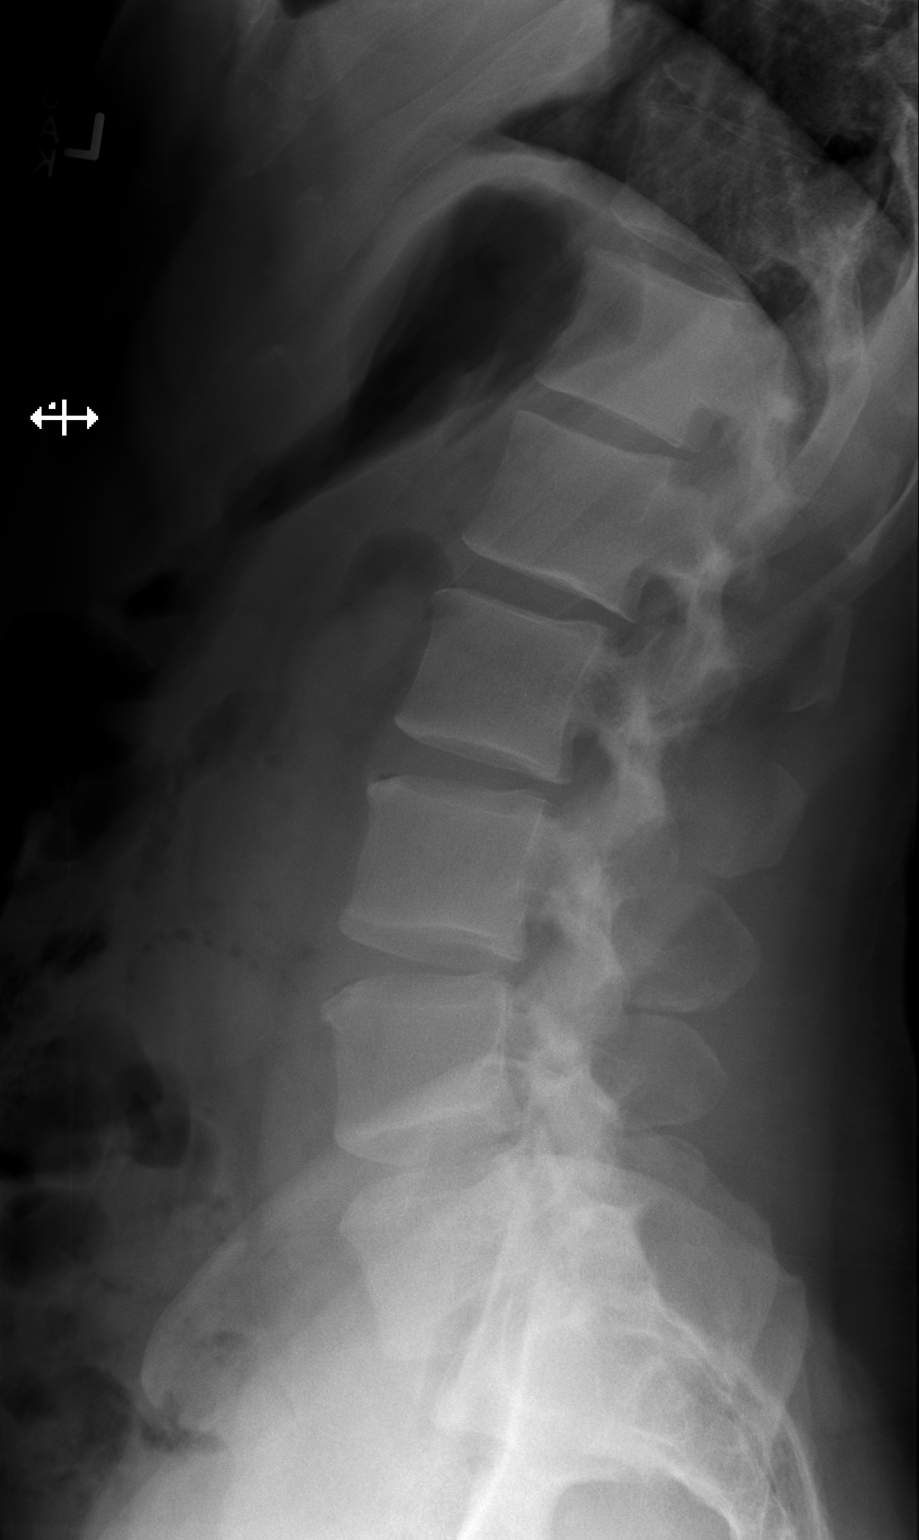

[w lumbar l-5 s-1 spot]
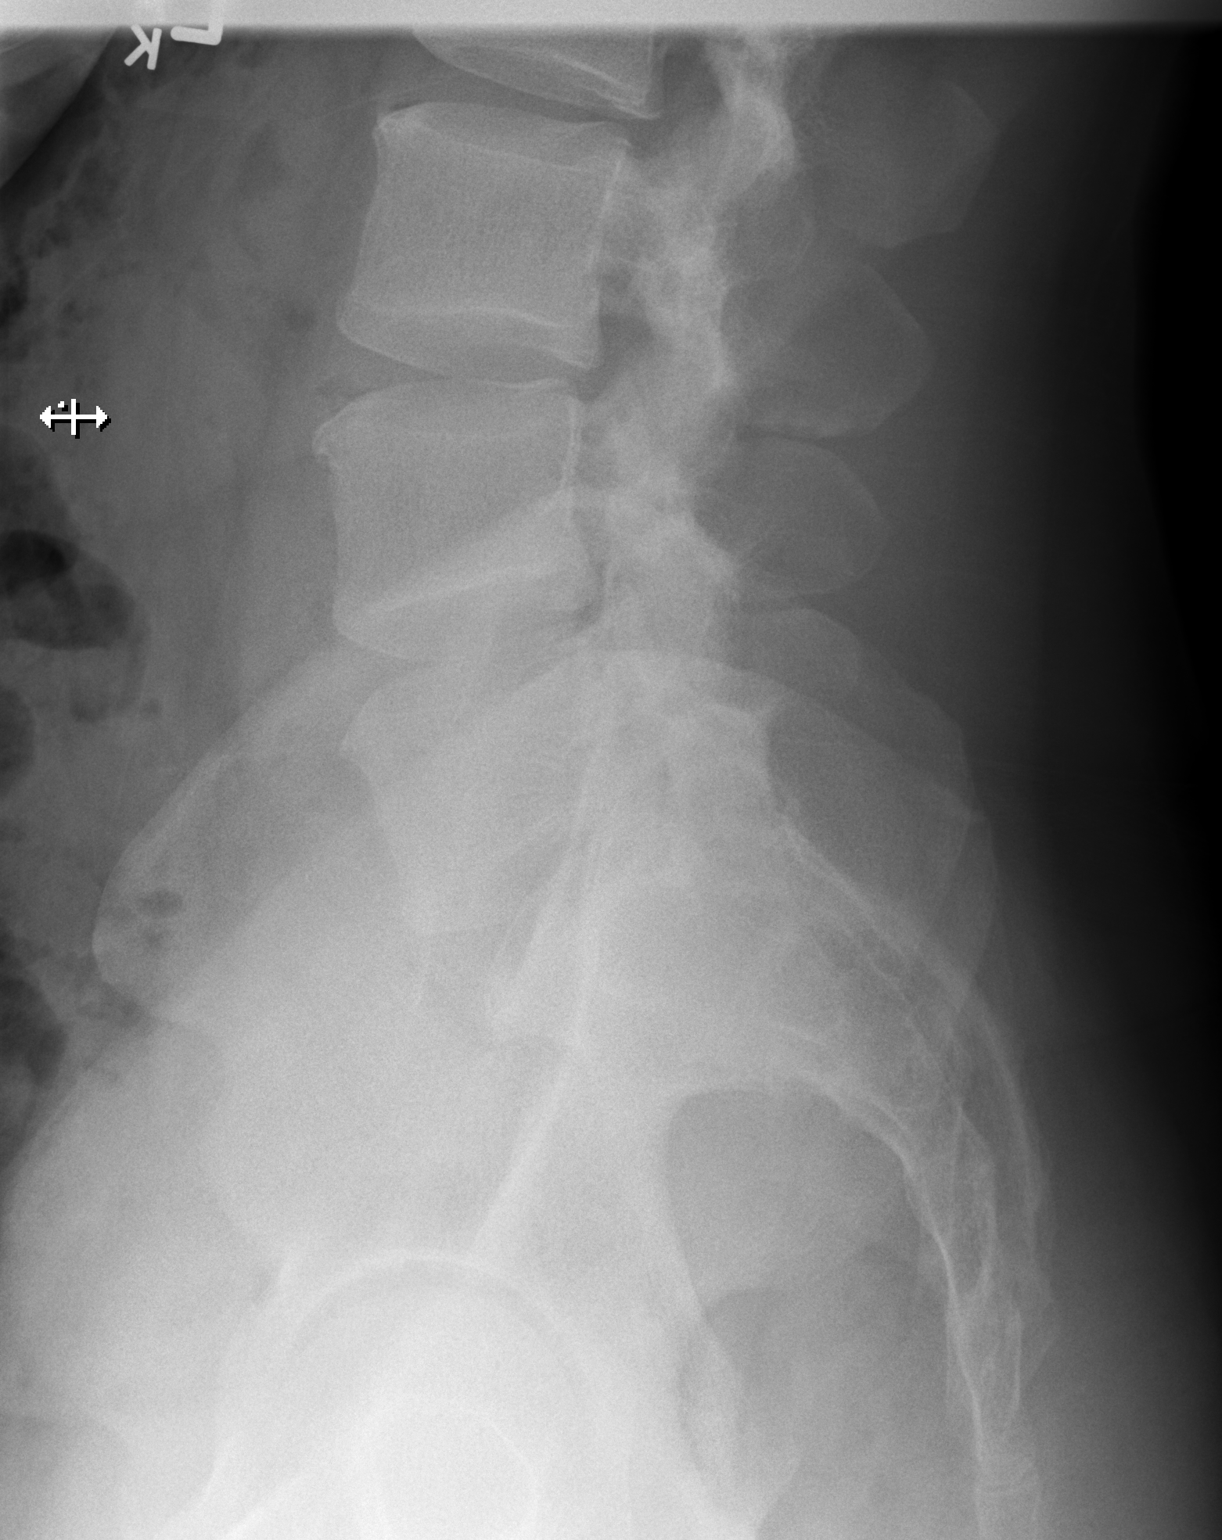

[3 of 3 positions shown; findings below may reference images not displayed]

FINDINGS: There is no evidence of lumbar spine fracture. Alignment is normal.
Intervertebral disc spaces are maintained.
IMPRESSION: Negative.

## 2022-02-09 ENCOUNTER — Ambulatory Visit: Payer: Commercial Managed Care - PPO | Attending: Student in an Organized Health Care Education/Training Program

## 2022-03-21 DIAGNOSIS — Z13220 Encounter for screening for lipoid disorders: Secondary | ICD-10-CM

## 2022-03-21 DIAGNOSIS — Z0000 Encounter for general adult medical examination without abnormal findings: Secondary | ICD-10-CM

## 2022-03-21 DIAGNOSIS — Z114 Encounter for screening for human immunodeficiency virus [HIV]: Secondary | ICD-10-CM

## 2022-03-21 DIAGNOSIS — Z1159 Encounter for screening for other viral diseases: Secondary | ICD-10-CM

## 2022-03-21 NOTE — Progress Notes
[facility]  [address], [address]  [place], [place] [address]    Outpatient Progress Note     Patient: Derrick Martin  MRN: [id number]  Primary Care Physician: No primary care provider on file.  Date of Service: 03/21/2022      History     No chief complaint on file.      History of Present Illness:  Derrick Martin is a 41 y.o. male with the following chronic medical conditions who presents for CPE/est care:    Chronic Conditions (Reviewed/Updated Problem List)   There are no problems to display for this patient.       Patient acute concerns    Patient chronic condition concerns    Medication Review    Preventive Health  Exercise - regular  Diet - balanced, no restrictions  Blood Pressure - within goal <130/80  BMI - There is no height or weight on file to calculate BMI.  PHQ2 - negative  Hepatitis C (born 89-1963) - NA  AAA - does not meet criteria    Cancer Screening  Colon - colonoscopy at age 73  Prostate - discussed risks & benefits of PSA screening  Lung - does not meet criteria for low-dose CT scan    Vaccinations  Influenza - recommend annually  TDAP - recommend every 10 years  Hepatitis A - check immune status  Hepatitis B - check immune status  Shingles - age 44  Pneumovax - age 63  83 - age 63      There is no immunization history on file for this patient.     Screening for STIs  HIV, gonorrhea, chlamydia, syphilis, hepatitis B & C        Medications reviewed below.    Medications    No outpatient medications have been marked as taking for the 03/22/22 encounter (Appointment) with Caryn Bee L. Danella Penton, MD.     No outpatient medications prior to visit.     No facility-administered medications prior to visit.        Allergies Additional PMH/FH/SHx   Not on File  family history is not on file.  Social History     Tobacco Use   ? Smoking status: Not on file   ? Smokeless tobacco: Not on file   Substance Use Topics   ? Alcohol use: Not on file     Social History Social History Narrative   ? Not on file        Review of Systems     CONSTITUTIONAL: Denies weight loss, fever and chills.  HEENT: Denies changes in vision and hearing.  RESPIRATORY: Denies SOB and cough.  CV: Denies palpitations and CP.   GI: Denies abdominal pain, nausea, vomiting and diarrhea.  GU: Denies dysuria and urinary frequency.  MSK: Denies myalgia and joint pain.  SKIN: Denies rash and pruritus.  NEUROLOGICAL: Denies headache and syncope.  PSYCHIATRIC: Denies recent changes in mood. Denies anxiety and depression.    Physical Exam     Vitals:  Weight   There were no vitals taken for this visit.     Wt Readings from Last 3 Encounters:   No data found for Wt     There is no height or weight on file to calculate BMI.      GENERAL: Alert and oriented x 3. No acute distress. Well-nourished.  EYES: EOMI. Anicteric.  HENT: Moist mucous membranes. No scleral icterus. No cervical lymphadenopathy.  LUNGS: Clear to auscultation  bilaterally. No accessory muscle use.  CARDIOVASCULAR: Regular rate and rhythm. No murmur. No JVD.  ABDOMEN: Soft, non-tender and non-distended. No palpable masses.  EXTREMITIES: No edema. Non-tender.  SKIN: No rashes or lesions. Warm.  NEUROLOGIC: No focal neurological deficits. CN II-XII grossly intact, but not individually tested.  PSYCHIATRIC: Cooperative. Appropriate mood and affect.    Data      Labs:   No results found for: ''HCT'', ''HGB'', ''MCV'', ''PLT'', ''WBC''  No results found for: ''BUN'', ''CREAT'', ''K'', ''NA''  No results found for: ''TSH''     Other Data:  No results found for: ''HGBA1C''  2018 ACC/AHA guidelines recommends that patient is not in statin benefit group. Encourage adherence to heart-healthy lifestyle.    10-Year ASCVD risk cannot be calculated because at least one required variable is not available in [facility] as of 2:42 PM on 03/21/2022.  10-Year ASCVD risk with optimal risk factors cannot be calculated.  Values used to calculate ASCVD score:  Age: 41 y.o. Cannot calculate risk because age is not between 42 and 77 years old.  Gender: Male Race: Unknown  Cannot calculate risk because HDL cholesterol not documented within the past 5 years.    Cannot calculate risk because Total cholesterol not documented within the past 5 years.    LDL cholesterol not documented within the past 5 years.    Systolic BP 0 mm Hg. Cannot calculate risk because SBP <90 mm Hg.    The patient is not being treated with a medication that influences SBP.  Cannot calculate risk because smoking status is not documented.  The patient does not have a diagnosis of diabetes.  Click here for the [facility] ASCVD Cardiovascular Risk Estimator Plus tool Office manager).  No results found for: ''CHOL'', ''CHOLDLCAL'', ''CHOLDLQ'', ''CHOLHDL'', ''TRIGLY''     PHQ-9 Results       No data to display                GAD-7 Results       No data to display                DAST Results       No data to display                Audit-C results       No data to display                Studies:  No imaging has been resulted in the last 365 days    Assessment/Plan     [.kltapbox]  Derrick Martin is a 41 y.o. male who presents for screening, prevention, and management of medical conditions:    # ***    []  Stable  []  Improved  []  Worsened   # ***    []  Stable  []  Improved  []  Worsened   # ***    []  Stable  []  Improved  []  Worsened   #    []  Stable  []  Improved  []  Worsened   #    []  Stable  []  Improved  []  Worsened   #     []  Stable  []  Improved  []  Worsened   #     []  Stable  []  Improved  []  Worsened   #    []  Stable  []  Improved  []  Worsened   #    []  Stable  []  Improved  []  Worsened       ICD-10-CM    1.  Routine general medical examination at a health care facility  Z00.00           [.[email or web address]]  Problem List Items Addressed This Visit    None  Visit Diagnoses     Routine general medical examination at a health care facility    -  Primary          [.diagmed]  1. Routine general medical examination at a health care facility  ***      Health Maintenance   Health Maintenance   Topic Date Due   ? COVID-19 Vaccine(Tracks primary and booster doses, not sup/immunocomp) (1) Never done   ? Hepatitis B Screening  Never done   ? Hepatitis C Screening  Never done   ? HIV Screening  Never done   ? Tdap/Td Vaccine (1 - Tdap) Never done   ? Influenza Vaccine (Season Ended) 06/29/2022       There is no immunization history on file for this patient.     No follow-ups on file.    Future Appointments   Date Time Provider Department Center   03/22/2022  2:30 PM Caryn Bee L. Danella Penton, MD [facility]       The above plan of care, diagnosis, orders, and follow-up were discussed with the patient.  Questions related to this recommended plan of care were answered.  See AVS for additional information and counseling materials provided to the patient.    Discussed with Attending: Dr. Caryn Bee L. Danella Penton, MD 03/21/2022 2:42 PM  [facility], PGY-2  [phone]

## 2022-03-21 NOTE — Patient Instructions
[facility]  [address], [address]  [place], [place] [address]    A Note from [place] L. Danella Penton, MD    Thank you for choosing the [facility] for your care!    Our plan, as discussed today is as follows:  1.  2.  3.     No follow-ups on file.    Please call the office at [phone] if you have any questions or need to return sooner for any reason. My clinic partners for days when I am off-site are Dr. Elicia Lamp and Dr. Dorthula Nettles.    Regards,  Anna Genre. Danella Penton, MD    [facility]  [address], [address]  [place], [place] [address]  -----------------------------------------------------------------------------------------------------------------------    About our resident practice    Our primary care medicine resident practice provides high-quality primary care with a focus on the prevention of disease, management of common chronic diseases, evaluation of minor illnesses/injuries, and coordination of care for patients with complex disorders (between subspecialties and between the hospital and clinic).    As the top-ranked medical center in the [place], we draw our resident physicians from among the highest-performing medical students at the best medical schools in the country.     By offering visits with our primary care medicine resident physicians, we enhance patients? access to same-day or next-day visits, offer patients longer visits and more thorough consultations than found in community practices, and keep the entire practice up to date with the latest medical evidence.    We expect that you will experience a high quality of care at your visit today and we encourage you to follow up for continued care in our resident clinic.    ----------------------------------------------------------------------------------------------------------------------    Frequently asked questions:    1. How is the resident practice different from a community primary care practice?    Resident physicians spend several months working in the [facility] caring for acutely ill patients. For this reason, they are not in the clinic every day and will offer follow-up care and urgent care through their practice partners when they are off-site. This partnership opens up more same-day or next-day appointment availability than most doctors? offices.  Residents discuss your case with a senior physician (professors from the [employer] of [employer]) during the visit, prior to discussing the final plan of care with you. These are often nationally recognized experts in Internal Medicine-Primary Care, Women?s Health, and Geriatrics.    2. How do I follow through on the plan from my office visit?    Instructions/advice: The medical assistant or front office staff will print out any written advice from your visit either in the room after your visit or at the check-out station in front of the clinic.    Laboratory tests: You may show up to any [facility] and provide your name and date of birth and they will be able to find your orders for any lab tests.    Across from our clinic we have a [facility] lab which is open from 8am to 5pm  At your request, labs can be sent to an outside lab (e.g. [facility] or [facility])    Imaging studies: General X-Ray can be done same-day on the first floor of our building. Advanced imaging and diagnostic studies generally require insurance review and approval prior to scheduling. At ?Check Out? they will provide information for scheduling.  Referrals: Stop at the front desk; they will verify that the referral has been placed. They will inform you if you can schedule your appointment immediately or if you need to wait for insurance authorization.      Follow-up: The medical assistant or front office staff will schedule your follow-up visit at the end of your visit. If you prefer to schedule at a later time, you can call the clinic appointment line.  If you have seen someone other than your primary care provider for an urgent visit, it is okay to schedule your follow-up with either the doctor who saw you for urgent care or your primary care physician.     3. How do I get my test results from the visit?    If you sign up for [facility] online ([email or web address]), we will release test results online with comments once your test results are available, usually within 1 week. Some specialized test results may take several weeks.  If you are not signed up for [facility] when your results become available, we will send your test results by letter within 1-2 weeks of your visit.    If an urgent test is ordered in your visit, such as an X-ray to look for a broken bone, our team will give you a call to make sure that you are aware of the results.  If another doctor orders a test for you, it is best to speak first with that doctor directly about the result.  Please contact our office if you have not received a result in the expected time frame.    4. Can I ask questions after the visit?    Yes! It is important to let us know if you have any trouble with the treatment plan that we agree upon or if your symptom(s) are not improving as expected. For non-emergency contact, you may reach your doctor in two ways:    Online: send non-urgent medical questions through the [facility] website ([email or web address]). These are usually returned within 1-2 business days.  Call the [facility] at [phone] during business hours to leave a message (or schedule a return appointment). Calls will be returned on the same business day (after the clinic closes) unless the doctor is out of the office, in which case they will be forwarded to the covering doctor within 1 business day.      5. How much will my medical care cost?    The bill(s) for your visit depend on many factors, including the number and seriousness of medical issues addressed, the amount of time the doctor spends working on your medical care, and tests/procedures/treatments rendered. Your out-of-pocket costs depend on your health insurance. It is your responsibility to ensure that your insurance plan covers laboratory tests, radiology procedures, specialist consultations, immunizations, and any other services.     An ''annual physical'' (review of illness prevention), is covered by most insurance plans under the Affordable Care Act, but is limited to a list of nationally recommended preventative services. If you wish to discuss other topics during time allotted for the annual physical or undergo testing unrelated to the prevention measures, this may generate additional costs to you.  Covered service list: [email or web address]  If medication costs are a concern, talk to your pharmacist and to [place] about lower cost medications that may be effective for you.    6. How do I get a referral to a specialist?    It is usually best to  see a primary care physician prior to a specialty physician visit, especially if you are uncertain about the diagnosis or appropriate specialist. We treat many conditions in primary care and we coordinate care between specialists. If you have PPO insurance, you may be able to self-refer to specialists at [facility]. If you have HMO insurance (and some other insurance types), it will be necessary to see your primary care physician for a referral.    For the [facility] HMO, if your non-urgent referral requires pre-authorization, you will be notified of the decision within 7 to 10 working days. If you have not received written or verbal notification from [facility] or from your physician within 10 working days, please contact Physician Support Services at [phone].    7. How do I get after-hours care?    XBJYNWGN: We strive to offer same-day appointments in our office Monday-Friday, 8am-5pm.   Call during these hours to request an urgent appointment: [phone].     Evenings/weekends: Our colleagues offer access to after-hours care at our walk-in clinic:    [facility]  [address], [address]   [place], [place] [address]  [phone]  Monday - Friday: 5:00 am to 9:00 pm  Saturday - Sunday: 9:00 am to 1:00 pm  Laboratory and x-ray services available    The [facility] has additional urgent care sites, see locations at: [email or web address]    Paging: If you need to have your doctor (or the on-call doctor) paged through our emergency line, call [phone].    For serious and life-threatening concerns you should call 911.    If you require hospitalization, make certain your hospital doctor gives [place] a call or faxes your summary to [place]. We coordinate closely with [facility] physicians at the following sites:  [facility] in [place] [facility] in [facility]  [facility] in [place] [facility] in [facility]  [facility] in [facility] in [facility] in [facility] in [facility] in [facility]        Advice for effective doctor visits:    1. Make a list  For primary care, write down your top priorities for the visit and bring the list with you. Usually 1-3 items is a good number. Setting realistic goals beforehand will help you leave satisfied. We will always address your top item, unless we identify an emergency that must be addressed first. If needed, we can plan a follow-up visit for any items that we do not have time to address in a single visit. Items may include:  Going over your complete medical history and family history  Discussing your overall health goals, preventative health (how to stay healthy and prevent disease or injury) and/or a general physical exam  Talking about a new symptom  Reviewing your progress with a previous symptom or medical condition  Having a form filled out  Asking a general medical question  Discussing your options for a medical decision  Requesting a test or referral  Getting a prescription medication  Some medications require an office visit to assess your health on the medication. If this applies to your medication, make sure your appointment is scheduled ahead of you running  out of medication.  Some medications require a care plan from a specialist with expertise in safety and monitoring requirements; we can discuss if this applies to any of your medicines.  If you are seeing a specialist, the visit is more likely to be focused on a specific medical question (or a specific disease). Make sure you know the reason for the visit beforehand.    2. Bring your things (and people)  Pill bottles to help [place] review all of your current medications and supplements (especially if changes have been made or we need to answer specific questions about what to take and when)  Blood pressure cuff - for [place] to help you check its accuracy  Your health log(s) - are you tracking blood pressure? Blood sugar? Your diet? Your temperature? Did you take a picture of the rash at its worst?  Your supporter(s) - it can be helpful to have a family member, caregiver, or close friend accompany you to listen, take notes, and offer support.  Your health insurance card and ID for the front desk    3. Come on time  We do our best to see all of our patients on time, and so we cannot run late with your appointment (except in emergency circumstances). It will take several minutes for you to check in once you arrive, so it?s best to arrive at least 15 minutes early for your visit   The parking lot can be congested; allow extra time for late morning and early afternoon appointments.   If you have special needs, such as an interpreter, please let us know in advance.  The front desk will know if we are running behind, so please ask them if you find yourself waiting longer than expected to be called to an exam room.  It's best not to cancel at the last minute - your appointment spot was held for you (and therefore not available to our other patients). If you know that you cannot come in as scheduled, please call as early as you can to find another time in the schedule that you can make.    4. Please make sure your address and phone number are up-to-date in our system when you check in. We use these to contact you about important health information, including test results.    5. Bring your files  It is important to bring non-[place] health records with you to your visit for [place] to add to your records here. This helps [place] make medical decisions and avoids duplication of tests. To obtain your records, call the office or hospital where your care was provided and request the copy of records be released directly to you. Keep a copy for yourself and bring a copy for [place] to scan. If they offer to fax it to our office, the fax number is: [phone]. Important records include:  Immunizations (vaccines)  Medication list (include tablet size in milligrams ''mg'' and how often you take it)  Test results (blood tests, urine tests, x-rays, CT scans, MRIs, ultrasounds, heart tests, colonoscopy, pathology, or other). For radiology with abnormal findings, it is best to bring the radiology report and keep with you a CD with the images (for any later comparisons and specialty clinic visits).  Doctors' notes - especially summaries of testing and treatment  Hospitalization summaries (?Discharge summary?). Note: if you are hospitalized, make certain to schedule a hospital follow-up appointment with your primary care physician after discharge.  We may be able to access  your records if they are stored electronically at a cooperating institution. Check this list to see if your previous health organization participates in the record-sharing program: PopPod.Billings    If possible, prepare a printout of your medical history in sections: one for past and present illnesses and the treatment(s) you received, another for hospitalizations, a third for allergies, and a section for family history - including how old relatives were when they were diagnosed. If you need your records from [place], contact the Medical Records office at [phone] or see instructions on the website: [email or web address].es.    6. Do your homework  It is important that you inform yourself as much as possible about your health.     What is recommended for someone your age? Find out for yourself:   GolfingGoddess.com.br    Look up the answers to medical questions online, but be careful of the source when reading online health information. For a trustworthy starting place, we recommend MedlinePlus:   LocalsBoard.pl    Review medication information given to you by the pharmacist and ask questions of the pharmacist or [place] if you need clarification.

## 2022-03-22 ENCOUNTER — Ambulatory Visit: Payer: Commercial Managed Care - PPO

## 2022-03-22 ENCOUNTER — Ambulatory Visit: Payer: BLUE CROSS/BLUE SHIELD | Attending: Student in an Organized Health Care Education/Training Program

## 2022-05-02 DIAGNOSIS — Z0000 Encounter for general adult medical examination without abnormal findings: Secondary | ICD-10-CM

## 2022-05-02 NOTE — Progress Notes
INTERNAL MEDICINE RESIDENT CLINIC HISTORY AND PHYSICAL    Patient: Derrick Martin  MRN: [id number]  Primary Care Physician: No primary care provider on file.  Date of Service: 05/02/2022      History:     Present in the exam room: {Pt_Fam_CG_Other:305001}     No chief complaint on file.    History of Present Illness:  Derrick Martin is a 41 y.o. male who presents for adult wellness visit and for management of the following problems:    ***  I performed the referenced procedure/examination in the presence of a chaperone, ***..do  Health Maintenance Reviewed:    Health Maintenance   Topic Date Due   ? COVID-19 Vaccine(Tracks primary and booster doses, not sup/immunocomp) (1) Never done   ? Hepatitis B Screening  Never done   ? Hepatitis C Screening  Never done   ? HIV Screening  Never done   ? Tdap/Td Vaccine (1 - Tdap) Never done   ? Influenza Vaccine (1) 06/29/2022       There is no immunization history on file for this patient.    Chronic conditions (Reviewed/Updated Problem List):  There are no problems to display for this patient.    Past Medical History:  No past medical history on file.     Past Surgical History:  No past surgical history on file.     Medications:  No outpatient medications have been marked as taking for the 05/03/22 encounter (Appointment) with Dorothy Spark., MD.        Allergies:  Not on File     SOCIAL HISTORY:   has no history on file for tobacco use, alcohol use, and drug use.    FAMILY HISTORY: family history is not on file.    Physical Exam:      Vitals:   There were no vitals taken for this visit.    Wt Readings from Last 3 Encounters:   No data found for Wt     General: Well appearing, well nourished, in no acute distress. Ambulating without difficulty  HEENT: normocephalic, atraumatic. Sclera non-icteric. EOMI. PERRL. Mucous membranes moist. Neck supple.   Heart: RRR. S1S2. No murmurs  Lungs: Clear to ausculation bilaterally. No wheezing. No accessory muscle use  Abdomen: Soft, non-tender, non-distended  Back: spine normal without deformity or tenderness. No CVA tenderness  Extrem: No LE edema. Distal pulses symmetric and intact  Neuro: A&Ox3. CN 2-12 intact. Moving all extremities spontaneously. Sensation grossly intact x4 extremities      Data:      Labs:   No results found for: ''TSH''   No results found for: ''WBC'', ''HGB'', ''HCT'', ''MCV'', ''PLT''  No results found for: ''CREAT'', ''BUN'', ''NA'', ''K''  No results found for: ''CHOL'', ''CHOLHDL'', ''CHOLDLQ'', ''CHOLDLCAL'', ''TRIGLY''     Assessment/Plan:      Derrick Martin is a 41 y.o. male who presents for screening, prevention, and management of medical conditions:    ***    There are no diagnoses linked to this encounter.     No follow-ups on file.    Future Appointments   Date Time Provider Department Center   05/03/2022  9:30 AM Fabien Travelstead, Kyla Balzarine., MD [facility]       The above plan of care, diagnosis, orders, and follow-up were discussed with the patient.  Questions related to this recommended plan of care were answered.  See AVS for additional information and counseling materials provided to the patient.    Discussed with  attending Dr. Elmyra Ricks, MD  [facility] Internal Medicine PGY2

## 2022-05-03 ENCOUNTER — Ambulatory Visit: Payer: Commercial Managed Care - PPO

## 2022-08-30 ENCOUNTER — Telehealth: Payer: Self-pay

## 2022-08-30 NOTE — Telephone Encounter (Signed)
Okay for pt to re-establish?

## 2022-08-30 NOTE — Telephone Encounter (Signed)
Pt called stating that he was wanting to have a physical done. After reviewing chart, advised that he was no longer established with Dr. Nani Ravens due to a 3+ year gap in care. Pt stated he would like to re-establish care with Dr. Nani Ravens. Advised that a note would be sent back to get his approval for this. Pt acknowledged understanding.  Is care re-establishement ok for this pt?

## 2022-08-31 NOTE — Telephone Encounter (Signed)
OK 

## 2022-09-05 NOTE — Telephone Encounter (Signed)
Pt called and scheduled

## 2022-09-25 ENCOUNTER — Encounter: Payer: Self-pay | Admitting: Family Medicine

## 2022-09-25 ENCOUNTER — Ambulatory Visit: Payer: BC Managed Care – PPO | Admitting: Family Medicine

## 2022-09-25 VITALS — BP 132/85 | HR 70 | Temp 98.3°F | Ht 73.0 in | Wt 214.0 lb

## 2022-09-25 DIAGNOSIS — Z0000 Encounter for general adult medical examination without abnormal findings: Secondary | ICD-10-CM | POA: Diagnosis not present

## 2022-09-25 DIAGNOSIS — Z1159 Encounter for screening for other viral diseases: Secondary | ICD-10-CM

## 2022-09-25 NOTE — Addendum Note (Signed)
Addended by: Mervin Kung A on: 09/25/2022 03:34 PM   Modules accepted: Orders

## 2022-09-25 NOTE — Progress Notes (Signed)
Chief Complaint  Patient presents with   New Patient (Initial Visit)    Well Male Jacob Krause is here for a complete physical.   His last physical was >1 year ago.  Current diet: in general, a "healthy" diet.   Current exercise: walking, lifting wts Weight trend: stable Fatigue out of ordinary? No. Seat belt? Yes.   Advanced directive? No  Health maintenance Tetanus- Yes HIV- Yes Hep C- No  Past Medical History:  Diagnosis Date   Obesity 07/27/2016     Past Surgical History:  Procedure Laterality Date   NO PAST SURGERIES      Medications  Take no meds routinely.     Allergies No Known Allergies  Family History Family History  Problem Relation Age of Onset   Cancer Father    Diabetes Father    Diabetes Paternal Grandmother    Heart attack Paternal Grandfather     Review of Systems: Constitutional: no fevers or chills Eye:  no recent significant change in vision Ear/Nose/Mouth/Throat:  Ears:  no hearing loss Nose/Mouth/Throat:  no complaints of nasal congestion, no sore throat Cardiovascular:  no chest pain Respiratory:  no shortness of breath Gastrointestinal:  no abdominal pain, no change in bowel habits GU:  Male: negative for dysuria Musculoskeletal/Extremities:  no pain of the joints Integumentary (Skin/Breast):  no abnormal skin lesions reported Neurologic:  no headaches Endocrine: No unexpected weight changes Hematologic/Lymphatic:  no night sweats  Exam BP 132/85 (BP Location: Left Arm, Patient Position: Sitting, Cuff Size: Normal)   Pulse 70   Temp 98.3 F (36.8 C) (Oral)   Ht 6\' 1"  (1.854 m)   Wt 214 lb (97.1 kg)   SpO2 99%   BMI 28.23 kg/m  General:  well developed, well nourished, in no apparent distress Skin:  no significant moles, warts, or growths Head:  no masses, lesions, or tenderness Eyes:  pupils equal and round, sclera anicteric without injection Ears:  canals without lesions, TMs shiny without retraction, no  obvious effusion, no erythema Nose:  nares patent, mucosa normal Throat/Pharynx:  lips and gingiva without lesion; tongue and uvula midline; non-inflamed pharynx; no exudates or postnasal drainage Neck: neck supple without adenopathy, thyromegaly, or masses Lungs:  clear to auscultation, breath sounds equal bilaterally, no respiratory distress Cardio:  regular rate and rhythm, no bruits, no LE edema Abdomen:  abdomen soft, nontender; bowel sounds normal; no masses or organomegaly Genital (male): Deferred Rectal: Deferred Musculoskeletal:  symmetrical muscle groups noted without atrophy or deformity Extremities:  no clubbing, cyanosis, or edema, no deformities, no skin discoloration Neuro:  gait normal; deep tendon reflexes normal and symmetric Psych: well oriented with normal range of affect and appropriate judgment/insight  Assessment and Plan  Well adult exam   Well 41 y.o. male. Counseled on diet and exercise. CMP, lipid panel, Hep C Ab, CBC,  Self testicular exams recommended at least monthly.  Advanced directive form provided today.  Flu shot today. Other orders as above. Follow up in 1 year pending the above workup. The patient voiced understanding and agreement to the plan.  [address] [place], DO 09/25/22 3:21 PM

## 2022-09-26 ENCOUNTER — Other Ambulatory Visit (INDEPENDENT_AMBULATORY_CARE_PROVIDER_SITE_OTHER): Payer: BC Managed Care – PPO

## 2022-09-26 DIAGNOSIS — Z1159 Encounter for screening for other viral diseases: Secondary | ICD-10-CM

## 2022-09-26 DIAGNOSIS — Z0000 Encounter for general adult medical examination without abnormal findings: Secondary | ICD-10-CM | POA: Diagnosis not present

## 2022-09-26 LAB — LIPID PANEL
Cholesterol: 218 mg/dL — ABNORMAL HIGH (ref 0–200)
HDL: 54.4 mg/dL (ref >39.00)
LDL Cholesterol: 142 mg/dL — ABNORMAL HIGH (ref 0–99)
NonHDL: 163.39
Total CHOL/HDL Ratio: 4
Triglycerides: 106 mg/dL (ref 0.0–149.0)
VLDL: 21.2 mg/dL (ref 0.0–40.0)

## 2022-09-26 LAB — COMPREHENSIVE METABOLIC PANEL
ALT: 28 U/L (ref 0–53)
AST: 23 U/L (ref 0–37)
Albumin: 4.5 g/dL (ref 3.5–5.2)
Alkaline Phosphatase: 60 U/L (ref 39–117)
BUN: 7 mg/dL (ref 6–23)
CO2: 30 mEq/L (ref 19–32)
Calcium: 9.2 mg/dL (ref 8.4–10.5)
Chloride: 103 mEq/L (ref 96–112)
Creatinine, Ser: 0.87 mg/dL (ref 0.40–1.50)
GFR: 108.4 mL/min (ref >60.00)
Glucose, Bld: 95 mg/dL (ref 70–99)
Potassium: 4.6 mEq/L (ref 3.5–5.1)
Sodium: 138 mEq/L (ref 135–145)
Total Bilirubin: 0.4 mg/dL (ref 0.2–1.2)
Total Protein: 7.3 g/dL (ref 6.0–8.3)

## 2022-09-26 LAB — CBC
HCT: 44.8 % (ref 39.0–52.0)
Hemoglobin: 15 g/dL (ref 13.0–17.0)
MCHC: 33.6 g/dL (ref 30.0–36.0)
MCV: 83.2 fl (ref 78.0–100.0)
Platelets: 250 10*3/uL (ref 150.0–400.0)
RBC: 5.38 Mil/uL (ref 4.22–5.81)
RDW: 13.5 % (ref 11.5–15.5)
WBC: 7.8 10*3/uL (ref 4.0–10.5)

## 2022-09-26 LAB — HEPATITIS C ANTIBODY: Hepatitis C Ab: NONREACTIVE

## 2022-09-26 NOTE — Addendum Note (Signed)
Addended by: Rosita Kea on: 09/26/2022 08:23 AM   Modules accepted: Orders

## 2023-11-01 ENCOUNTER — Ambulatory Visit (INDEPENDENT_AMBULATORY_CARE_PROVIDER_SITE_OTHER): Payer: BC Managed Care – PPO | Admitting: Family Medicine

## 2023-11-01 VITALS — BP 126/82 | HR 88 | Temp 98.0°F | Resp 16 | Ht 73.0 in | Wt 236.8 lb

## 2023-11-01 DIAGNOSIS — M76891 Other specified enthesopathies of right lower limb, excluding foot: Secondary | ICD-10-CM | POA: Diagnosis not present

## 2023-11-01 DIAGNOSIS — Z0001 Encounter for general adult medical examination with abnormal findings: Secondary | ICD-10-CM | POA: Diagnosis not present

## 2023-11-01 DIAGNOSIS — Z0000 Encounter for general adult medical examination without abnormal findings: Secondary | ICD-10-CM

## 2023-11-01 DIAGNOSIS — M25532 Pain in left wrist: Secondary | ICD-10-CM | POA: Diagnosis not present

## 2023-11-01 DIAGNOSIS — Z13220 Encounter for screening for lipoid disorders: Secondary | ICD-10-CM

## 2023-11-01 LAB — COMPREHENSIVE METABOLIC PANEL
ALT: 28 U/L (ref 0–53)
AST: 21 U/L (ref 0–37)
Albumin: 4.5 g/dL (ref 3.5–5.2)
Alkaline Phosphatase: 66 U/L (ref 39–117)
BUN: 10 mg/dL (ref 6–23)
CO2: 26 meq/L (ref 19–32)
Calcium: 9.3 mg/dL (ref 8.4–10.5)
Chloride: 103 meq/L (ref 96–112)
Creatinine, Ser: 0.81 mg/dL (ref 0.40–1.50)
GFR: 109.92 mL/min (ref >60.00)
Glucose, Bld: 86 mg/dL (ref 70–99)
Potassium: 4.7 meq/L (ref 3.5–5.1)
Sodium: 136 meq/L (ref 135–145)
Total Bilirubin: 0.4 mg/dL (ref 0.2–1.2)
Total Protein: 7.2 g/dL (ref 6.0–8.3)

## 2023-11-01 LAB — LIPID PANEL
Cholesterol: 245 mg/dL — ABNORMAL HIGH (ref 0–200)
HDL: 56.8 mg/dL (ref >39.00)
LDL Cholesterol: 164 mg/dL — ABNORMAL HIGH (ref 0–99)
NonHDL: 188.19
Total CHOL/HDL Ratio: 4
Triglycerides: 120 mg/dL (ref 0.0–149.0)
VLDL: 24 mg/dL (ref 0.0–40.0)

## 2023-11-01 LAB — CBC
HCT: 46.6 % (ref 39.0–52.0)
Hemoglobin: 15.3 g/dL (ref 13.0–17.0)
MCHC: 32.8 g/dL (ref 30.0–36.0)
MCV: 82.9 fL (ref 78.0–100.0)
Platelets: 250 10*3/uL (ref 150.0–400.0)
RBC: 5.62 Mil/uL (ref 4.22–5.81)
RDW: 12.8 % (ref 11.5–15.5)
WBC: 7.8 10*3/uL (ref 4.0–10.5)

## 2023-11-01 NOTE — Progress Notes (Signed)
 Chief Complaint  Patient presents with   Annual Exam    Annual Exam    Well Male Jacob Krause is here for a complete physical.   His last physical was >1 year ago.  Current diet: in general, a healthy diet. Portions have been high.  Current exercise: none Weight trend: increased Fatigue out of ordinary? No. Seat belt? Yes.   Advanced directive? Yes  Health maintenance Tetanus- Due HIV- Yes Hep C- Yes  L hand/wrist pain Started after playing a lot of cricket and badminton around 2 mo ago. Hurt his R groin as well. He has been using compression w the groin and it has improved a little. No bruising, redness, swelling, decreased ROM. Has not taken any medication. Hurts to carry things with his wrist. Running bothers his hip flexor.   Past Medical History:  Diagnosis Date   Obesity 07/27/2016     Past Surgical History:  Procedure Laterality Date   NO PAST SURGERIES      Medications  Takes no meds routinely.     Allergies No Known Allergies  Family History Family History  Problem Relation Age of Onset   Cancer Father        NHL and pancreatic   Diabetes Father    Diabetes Paternal Grandmother    Heart attack Paternal Grandfather     Review of Systems: Constitutional: no fevers or chills Eye:  no recent significant change in vision Ear/Nose/Mouth/Throat:  Ears:  no hearing loss Nose/Mouth/Throat:  no complaints of nasal congestion, no sore throat Cardiovascular:  no chest pain Respiratory:  no shortness of breath Gastrointestinal:  no abdominal pain, no change in bowel habits GU:  Male: negative for dysuria, frequency, and incontinence Musculoskeletal/Extremities: As noted in HPI Integumentary (Skin/Breast):  no abnormal skin lesions reported Neurologic:  no headaches Endocrine: No unexpected weight changes Hematologic/Lymphatic:  no night sweats  Exam BP 126/82   Pulse 88   Temp 98 F (36.7 C) (Oral)   Resp 16   Ht 6' 1 (1.854 m)   Wt 236  lb 12.8 oz (107.4 kg)   SpO2 98%   BMI 31.24 kg/m  General:  well developed, well nourished, in no apparent distress Skin:  no significant moles, warts, or growths Head:  no masses, lesions, or tenderness Eyes:  pupils equal and round, sclera anicteric without injection Ears:  canals without lesions, TMs shiny without retraction, no obvious effusion, no erythema Nose:  nares patent, mucosa normal Throat/Pharynx:  lips and gingiva without lesion; tongue and uvula midline; non-inflamed pharynx; no exudates or postnasal drainage Neck: neck supple without adenopathy, thyromegaly, or masses Lungs:  clear to auscultation, breath sounds equal bilaterally, no respiratory distress Cardio:  regular rate and rhythm, no bruits, no LE edema Abdomen:  abdomen soft, nontender; bowel sounds normal; no masses or organomegaly Rectal: Deferred Musculoskeletal: mild ttp over the 1st MCP on L hand and R medial hip flexor; +Stinchfield on R, neg Finkelsteins;  symmetrical muscle groups noted without atrophy or deformity Extremities:  no clubbing, cyanosis, or edema, no deformities, no skin discoloration Neuro:  gait normal; deep tendon reflexes normal and symmetric Psych: well oriented with normal range of affect and appropriate judgment/insight  Assessment and Plan  Well adult exam - Plan: CBC, Comprehensive metabolic panel, Lipid panel  Hip flexor tendinitis, right  Left wrist pain - Plan: Ambulatory referral to Sports Medicine   Well 43 y.o. male. Counseled on diet and exercise. Advanced directive form requested today.  Flu shot  politely declined.  Hip: Stretches/exercises, cont compression, Tylenol , ice, heat. Wrist: Cont thumb spica but wear at night and during aggravating activities. Heat, ice, Tylenol , stretches, refer sports med.  Other orders as above. Follow up in 1 yr pending the above workup. The patient voiced understanding and agreement to the plan.  Mabel [place],  DO 11/01/23 10:26 AM

## 2023-11-01 NOTE — Patient Instructions (Addendum)
 Give us  2-3 business days to get the results of your labs back.   Keep the diet clean and stay active.  Please get me a copy of your advanced directive form at your convenience.   Heat (pad or rice pillow in microwave) over affected area, 10-15 minutes twice daily.   Ice/cold pack over area for 10-15 min twice daily.  OK to take Tylenol  1000 mg (2 extra strength tabs) or 975 mg (3 regular strength tabs) every 6 hours as needed.  Wear your splint at night and during aggravating activities during the day.   If you do not hear anything about your referral in the next 1-2 weeks, call our office and ask for an update.  Let us  know if you need anything.  Hip Exercises It is normal to feel mild stretching, pulling, tightness, or discomfort as you do these exercises, but you should stop right away if you feel sudden pain or your pain gets worse.   STRETCHING AND RANGE OF MOTION EXERCISES These exercises warm up your muscles and joints and improve the movement and flexibility of your hip. These exercises also help to relieve pain, numbness, and tingling. Exercise A: Hamstrings, Supine  Lie on your back. Loop a belt or towel over the ball of your left / right foot. The ball of your foot is on the walking surface, right under your toes. Straighten your left / right knee and slowly pull on the belt to raise your leg. Do not let your left / right knee bend while you do this. Keep your other leg flat on the floor. Raise the left / right leg until you feel a gentle stretch behind your left / right knee or thigh. Hold this position for 30 seconds. Slowly return your leg to the starting position. Repeat2 times. Complete this stretch 3 times per week. Exercise B: Hip Rotators  Lie on your back on a firm surface. Hold your left / right knee with your left / right hand. Hold your ankle with your other hand. Gently pull your left / right knee and rotate your lower leg toward your other  shoulder. Pull until you feel a stretch in your buttocks. Keep your hips and shoulders firmly planted while you do this stretch. Hold this position for 30 seconds. Repeat 2 times. Complete this stretch 3 times per week. Exercise C: V-Sit (Hamstrings and Adductors)  Sit on the floor with your legs extended in a large "V" shape. Keep your knees straight during this exercise. Start with your head and chest upright, then bend at your waist to reach for your left foot (position A). You should feel a stretch in your right inner thigh. Hold this position for 30 seconds. Then slowly return to the upright position. Bend at your waist to reach forward (position B). You should feel a stretch behind both of your thighs and knees. Hold this position for 30 seconds. Then slowly return to the upright position. Bend at your waist to reach for your right foot (position C). You should feel a stretch in your left inner thigh. Hold this position for 30 seconds. Then slowly return to the upright position. Repeat A, B, and C 2 times each. Complete this stretch 3 times per week. Exercise D: Lunge (Hip Flexors)  Place your left / right knee on the floor and bend your other knee so that is directly over your ankle. You should be half-kneeling. Keep good posture with your head over your shoulders. Tighten your  buttocks to point your tailbone downward. This helps your back to keep from arching too much. You should feel a gentle stretch in the front of your left / right thigh and hip. If you do not feel any resistance, slightly slide your other foot forward and then slowly lunge forward so your knee once again lines up over your ankle. Make sure your tailbone continues to point downward. Hold this position for 30 seconds. Repeat 2 times. Complete this stretch 3 times per week.  STRENGTHENING EXERCISES These exercises build strength and endurance in your hip. Endurance is the ability to use your muscles for a long  time, even after they get tired. Exercise E: Bridge (Hip Extensors)  Lie on your back on a firm surface with your knees bent and your feet flat on the floor. Tighten your buttocks muscles and lift your bottom off the floor until the trunk of your body is level with your thighs. Do not arch your back. You should feel the muscles working in your buttocks and the back of your thighs. If you do not feel these muscles, slide your feet 1-2 inches (2.5-5 cm) farther away from your buttocks. Hold this position for 3 seconds. Slowly lower your hips to the starting position. Repeat for a total of 10 repetitions. Let your muscles relax completely between repetitions. If this exercise is too easy, try doing it with your arms crossed over your chest. Repeat 2 times. Complete this exercise 3 times per week. Exercise F: Straight Leg Raises - Hip Abductors  Lie on your side with your left / right leg in the top position. Lie so your head, shoulder, knee, and hip line up with each other. You may bend your bottom knee to help you balance. Roll your hips slightly forward, so your hips are stacked directly over each other and your left / right knee is facing forward. Leading with your heel, lift your top leg 4-6 inches (10-15 cm). You should feel the muscles in your outer hip lifting. Do not let your foot drift forward. Do not let your knee roll toward the ceiling. Hold this position for 1 second. Slowly return to the starting position. Let your muscles relax completely between repetitions. Repeat for a total of 10 repetitions.  Repeat 2 times. Complete this exercise 3 times per week. Exercise G: Straight Leg Raises - Hip Adductors  Lie on your side with your left / right leg in the bottom position. Lie so your head, shoulder, knee, and hip line up. You may place your upper foot in front to help you balance. Roll your hips slightly forward, so your hips are stacked directly over each other and your left /  right knee is facing forward. Tense the muscles in your inner thigh and lift your bottom leg 4-6 inches (10-15 cm). Hold this position for 1 second. Slowly return to the starting position. Let your muscles relax completely between repetitions. Repeat for a total of 10 repetitions. Repeat 2 times. Complete this exercise 3 times per week. Exercise H: Straight Leg Raises - Quadriceps  Lie on your back with your left / right leg extended and your other knee bent. Tense the muscles in the front of your left / right thigh. When you do this, you should see your kneecap slide up or see increased dimpling just above your knee. Tighten these muscles even more and raise your leg 4-6 inches (10-15 cm) off the floor. Hold this position for 3 seconds. Keep these muscles  tense as you lower your leg. Relax the muscles slowly and completely between repetitions. Repeat for a total of 10 repetitions. Repeat 2 times. Complete this exercise 3 times per week. Exercise I: Hip Abductors, Standing Tie one end of a rubber exercise band or tubing to a secure surface, such as a table or pole. Loop the other end of the band or tubing around your left / right ankle. Keeping your ankle with the band or tubing directly opposite of the secured end, step away until there is tension in the tubing or band. Hold onto a chair as needed for balance. Lift your left / right leg out to your side. While you do this: Keep your back upright. Keep your shoulders over your hips. Keep your toes pointing forward. Make sure to use your hip muscles to lift your leg. Do not throw your leg or tip your body to lift your leg. Hold this position for 1 second. Slowly return to the starting position. Repeat for a total of 10 repetitions. Repeat 2 times. Complete this exercise 3 times per week. Exercise J: Squats (Quadriceps) Stand in a door frame so your feet and knees are in line with the frame. You may place your hands on the frame for  balance. Slowly bend your knees and lower your hips like you are going to sit in a chair. Keep your lower legs in a straight-up-and-down position. Do not let your hips go lower than your knees. Do not bend your knees lower than told by your health care provider. If your hip pain increases, do not bend as low. Hold this position for 1 second. Slowly push with your legs to return to standing. Do not use your hands to pull yourself to standing. Repeat for a total of 10 repetitions. Repeat 2 times. Complete this exercise 3 times per week. Make sure you discuss any questions you have with your health care provider. Document Released: 11/02/2005 Document Revised: 07/09/2016 Document Reviewed: 10/10/2015 Elsevier Interactive Patient Education  2018 [employer]  Wrist and Forearm Exercises Do exercises exactly as told by your health care provider and adjust them as directed. It is normal to feel mild stretching, pulling, tightness, or discomfort as you do these exercises, but you should stop right away if you feel sudden pain or your pain gets worse.   RANGE OF MOTION EXERCISES These exercises warm up your muscles and joints and improve the movement and flexibility of your injured wrist and forearm. These exercises also help to relieve pain, numbness, and tingling. These exercises are done using the muscles in your injured wrist and forearm. Exercise A: Wrist Flexion, Active With your fingers relaxed, bend your wrist forward as far as you can. Hold this position for 30 seconds. Repeat 2 times. Complete this exercise 3 times per week. Exercise B: Wrist Extension, Active With your fingers relaxed, bend your wrist backward as far as you can. Hold this position for 30 seconds. Repeat 2 times. Complete this exercise 3 times per week. Exercise C: Supination, Active  Stand or sit with your arms at your sides. Bend your left / right elbow to an L shape (90 degrees). Turn your palm upward until you  feel a gentle stretch on the inside of your forearm. Hold this position for 30 seconds. Slowly return your palm to the starting position. Repeat 2 times. Complete this exercise 3 times per week. Exercise D: Pronation, Active  Stand or sit with your arms at your sides. Bend your left /  right elbow to an L shape (90 degrees). Turn your palm downward until you feel a gentle stretch on the top of your forearm. Hold this position for 30 seconds. Slowly return your palm to the starting position. Repeat 2 times. Complete this exercise once a day.  STRETCHING EXERCISES These exercises warm up your muscles and joints and improve the movement and flexibility of your injured wrist and forearm. These exercises also help to relieve pain, numbness, and tingling. These exercises are done using your healthy wrist and forearm to help stretch the muscles in your injured wrist and forearm. Exercise E: Wrist Flexion, Passive  Extend your left / right arm in front of you, relax your wrist, and point your fingers downward. Gently push on the back of your hand. Stop when you feel a gentle stretch on the top of your forearm. Hold this position for 30 seconds. Repeat 2 times. Complete this exercise 3 times per week. Exercise F: Wrist Extension, Passive  Extend your left / right arm in front of you and turn your palm upward. Gently pull your palm and fingertips back so your fingers point downward. You should feel a gentle stretch on the palm-side of your forearm. Hold this position for 30 seconds. Repeat 2 times. Complete this exercise 3 times per week. Exercise G: Forearm Rotation, Supination, Passive Sit with your left / right elbow bent to an L shape (90 degrees) with your forearm resting on a table. Keeping your upper body and shoulder still, use your other hand to rotate your forearm palm-up until you feel a gentle to moderate stretch. Hold this position for 30 seconds. Slowly release the stretch and  return to the starting position. Repeat 2 times. Complete this exercise 3 times per week. Exercise H: Forearm Rotation, Pronation, Passive Sit with your left / right elbow bent to an L shape (90 degrees) with your forearm resting on a table. Keeping your upper body and shoulder still, use your other hand to rotate your forearm palm-down until you feel a gentle to moderate stretch. Hold this position for 30 seconds. Slowly release the stretch and return to the starting position. Repeat 2 times. Complete this exercise 3 times per week.  STRENGTHENING EXERCISES These exercises build strength and endurance in your wrist and forearm. Endurance is the ability to use your muscles for a long time, even after they get tired. Exercise I: Wrist Flexors  Sit with your left / right forearm supported on a table and your hand resting palm-up over the edge of the table. Your elbow should be bent to an L shape (about 90 degrees) and be below the level of your shoulder. Hold a 3-5 lb weight in your left / right hand. Or, hold a rubber exercise band or tube in both hands, keeping your hands at the same level and hip distance apart. There should be a slight tension in the exercise band or tube. Slowly curl your hand up toward your forearm. Hold this position for 3 seconds. Slowly lower your hand back to the starting position. Repeat 2 times. Complete this exercise 3 times per week. Exercise J: Wrist Extensors  Sit with your left / right forearm supported on a table and your hand resting palm-down over the edge of the table. Your elbow should be bent to an L shape (about 90 degrees) and be below the level of your shoulder. Hold a 3-5 lb weight in your left / right hand. Or, hold a rubber exercise band or tube  in both hands, keeping your hands at the same level and hip distance apart. There should be a slight tension in the exercise band or tube. Slowly curl your hand up toward your forearm. Hold this  position for 3 seconds. Slowly lower your hand back to the starting position. Repeat 2 times. Complete this exercise 3 times per week. Exercise K: Forearm Rotation, Supination  Sit with your left / right forearm supported on a table and your hand resting palm-down. Your elbow should be at your side, bent to an L shape (about 90 degrees), and below the level of your shoulder. Keep your wrist stable and in a neutral position throughout the exercise. Gently hold a lightweight hammer with your left / right hand. Without moving your elbow or wrist, slowly rotate your palm upward to a thumbs-up position. Hold this position for 3 seconds. Slowly return your forearm to the starting position. Repeat 2 times. Complete this exercise 3 times per week. Exercise L: Forearm Rotation, Pronation  Sit with your left / right forearm supported on a table and your hand resting palm-up. Your elbow should be at your side, bent to an L shape (about 90 degrees), and below the level of your shoulder. Keep your wrist stable. Do not allow it to move backward or forward during the exercise. Gently hold a lightweight hammer with your left / right hand. Without moving your elbow or wrist, slowly rotate your palm and hand upward to a thumbs-up position. Hold this position for 3 seconds. Slowly return your forearm to the starting position. Repeat 2 times. Complete this exercise 3 times per week. Exercise M: Grip Strengthening  Hold one of these items in your left / right hand: play dough, therapy putty, a dense sponge, a stress ball, or a large, rolled sock. Squeeze as hard as you can without increasing pain. Hold this position for 5 seconds. Slowly release your grip. Repeat 2 times. Complete this exercise 3 times per week.  This information is not intended to replace advice given to you by your health care provider. Make sure you discuss any questions you have with your health care provider. Document Released:  08/29/2005 Document Revised: 07/09/2016 Document Reviewed: 07/10/2015 Elsevier Interactive Patient Education  [employer].

## 2023-11-04 ENCOUNTER — Other Ambulatory Visit: Payer: Self-pay

## 2023-11-04 DIAGNOSIS — E7800 Pure hypercholesterolemia, unspecified: Secondary | ICD-10-CM

## 2023-11-05 ENCOUNTER — Ambulatory Visit (HOSPITAL_BASED_OUTPATIENT_CLINIC_OR_DEPARTMENT_OTHER)
Admission: RE | Admit: 2023-11-05 | Discharge: 2023-11-05 | Disposition: A | Discharge status: Home / Self Care | Payer: BC Managed Care – PPO | Source: Ambulatory Visit | Attending: Family Medicine | Admitting: Family Medicine

## 2023-11-05 ENCOUNTER — Encounter: Payer: Self-pay | Admitting: Family Medicine

## 2023-11-05 ENCOUNTER — Ambulatory Visit: Payer: BC Managed Care – PPO | Admitting: Family Medicine

## 2023-11-05 VITALS — BP 116/88 | Ht 73.0 in | Wt 236.0 lb

## 2023-11-05 DIAGNOSIS — M79643 Pain in unspecified hand: Secondary | ICD-10-CM | POA: Diagnosis not present

## 2023-11-05 DIAGNOSIS — M25539 Pain in unspecified wrist: Secondary | ICD-10-CM | POA: Insufficient documentation

## 2023-11-05 NOTE — Progress Notes (Signed)
 CHIEF COMPLAINT: No chief complaint on file.  _____________________________________________________________ SUBJECTIVE  HPI  Pt is a 43 y.o. male here for evaluation of Left wrist pain Ongoing for roughly 2-3 months/since November with cricket and badminton, playing with increased frequency.  Difficulty With holding things Was holding the bat and swinging from right to left and feels like he overextended his left wrist. 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   No facility-administered encounter medications on file as of 11/05/2023.    ------------------------------------------------------------------------------------------------------  _____________________________________________________________ OBJECTIVE  PHYSICAL EXAM  Today's Vitals   11/05/23 0830  BP: 116/88  Weight: 236 lb (107 kg)  Height: 6' 1 (1.854 m)   Body mass index is 31.14 kg/m.   reviewed  General: A+Ox3, no acute  distress, well-nourished, appropriate affect CV: pulses 2+ regular, nondiaphoretic, no peripheral edema, cap refill <2sec Lungs: no audible wheezing, non-labored breathing, bilateral chest rise/fall, nontachypneic Skin: warm, well-perfused, non-icteric, no susp lesions or rashes Neuro: Sensation intact, muscle tone wnl, no atrophy Psych: no signs of depression or anxiety MSK:     HAND/WRIST: L wrist clean/dry/intact skin. Full ROM without pain. no soft tissue swelling.  + tenderness along 1st extensor compartment.  - Finkelstein's. TTP 2nd wrist compartment, + snuff box tenderness.  -tinels at carpal tunnel, - Phalen's.  Reduced grip strength L compared to R, patient states this is baseline. - tenderness over carpals, metacarpals, phalanges. Ok sign thumb test negative. L wrist with FROM, discomfort with end extension, intact strength with resisted wrist extension, flexion, ulnar/radial deviation _____________________________________________________________ ASSESSMENT/PLAN Diagnoses and all orders for this visit:  Pain of radial side of wrist -     DG Wrist Complete Left; Future  Tenderness of anatomical snuffbox   Potential scaphoid involvement with anatomical snuffbox tenderness and pain w/ scaphoid compression test. Wrist XR ordered. Discussed possibility of obtaining wrist MRI given chronicity of symptoms. If findings reassuring will benefit from a course of PT. All questions answered. Return precautions discussed. Patient verbalized understanding and is in agreement with plan. Anticipate f/u pending XR  Electronically signed by: Kanai Berrios W Meribeth Vitug, MD 11/05/2023 7:33 AM

## 2023-11-12 ENCOUNTER — Other Ambulatory Visit: Payer: Self-pay | Admitting: Family Medicine

## 2023-11-12 NOTE — Progress Notes (Signed)
 Called, results discussed with patient. Shares that he overall has been doing better, but did recently start to feel some discomfort, still some trouble with lifting. Options discussed for additional workup, patient states he would like to continue with his brace for another week or so, continue with conservative therapy, will reach out if he is not improving and/or would like to have MRI/PT ordered. \

## 2024-02-03 ENCOUNTER — Other Ambulatory Visit: Payer: BC Managed Care – PPO
